# Patient Record
Sex: Female | Born: 1945 | Race: White | Hispanic: No | Marital: Single | Smoking: Never smoker
Health system: Southern US, Community
[De-identification: ages and names within clinical notes are randomized; demographics above are authoritative.]

## PROBLEM LIST (undated history)

## (undated) DIAGNOSIS — F419 Anxiety disorder, unspecified: Secondary | ICD-10-CM

## (undated) DIAGNOSIS — H18523 Epithelial (juvenile) corneal dystrophy, bilateral: Secondary | ICD-10-CM

## (undated) DIAGNOSIS — Z9889 Other specified postprocedural states: Secondary | ICD-10-CM

## (undated) DIAGNOSIS — Z8701 Personal history of pneumonia (recurrent): Secondary | ICD-10-CM

## (undated) DIAGNOSIS — C4491 Basal cell carcinoma of skin, unspecified: Secondary | ICD-10-CM

## (undated) DIAGNOSIS — R7303 Prediabetes: Secondary | ICD-10-CM

## (undated) DIAGNOSIS — C4492 Squamous cell carcinoma of skin, unspecified: Secondary | ICD-10-CM

## (undated) DIAGNOSIS — R112 Nausea with vomiting, unspecified: Secondary | ICD-10-CM

## (undated) DIAGNOSIS — J45909 Unspecified asthma, uncomplicated: Secondary | ICD-10-CM

## (undated) HISTORY — PX: HERNIA REPAIR: SHX51

## (undated) HISTORY — PX: CHOLECYSTECTOMY: SHX55

## (undated) HISTORY — PX: TUBAL LIGATION: SHX77

## (undated) HISTORY — PX: CATARACT EXTRACTION: SUR2

## (undated) HISTORY — PX: TONSILLECTOMY: SUR1361

## (undated) HISTORY — PX: DILATION AND CURETTAGE OF UTERUS: SHX78

---

## 1998-08-24 ENCOUNTER — Ambulatory Visit (HOSPITAL_COMMUNITY): Admission: RE | Admit: 1998-08-24 | Discharge: 1998-08-24 | Payer: Self-pay | Admitting: Psychiatry

## 1998-09-22 ENCOUNTER — Ambulatory Visit (HOSPITAL_COMMUNITY): Admission: RE | Admit: 1998-09-22 | Discharge: 1998-09-22 | Payer: Self-pay | Admitting: Psychiatry

## 1998-10-28 ENCOUNTER — Ambulatory Visit (HOSPITAL_COMMUNITY): Admission: RE | Admit: 1998-10-28 | Discharge: 1998-10-28 | Payer: Self-pay | Admitting: Psychiatry

## 1998-11-30 ENCOUNTER — Ambulatory Visit (HOSPITAL_COMMUNITY): Admission: RE | Admit: 1998-11-30 | Discharge: 1998-11-30 | Payer: Self-pay | Admitting: Psychiatry

## 1998-12-14 ENCOUNTER — Ambulatory Visit (HOSPITAL_COMMUNITY): Admission: RE | Admit: 1998-12-14 | Discharge: 1998-12-14 | Payer: Self-pay | Admitting: Psychiatry

## 1999-01-20 ENCOUNTER — Ambulatory Visit (HOSPITAL_COMMUNITY): Admission: RE | Admit: 1999-01-20 | Discharge: 1999-01-20 | Payer: Self-pay | Admitting: Psychiatry

## 1999-03-02 ENCOUNTER — Ambulatory Visit (HOSPITAL_COMMUNITY): Admission: RE | Admit: 1999-03-02 | Discharge: 1999-03-02 | Payer: Self-pay | Admitting: Psychiatry

## 1999-03-23 ENCOUNTER — Ambulatory Visit (HOSPITAL_COMMUNITY): Admission: RE | Admit: 1999-03-23 | Discharge: 1999-03-23 | Payer: Self-pay | Admitting: Psychiatry

## 1999-04-07 ENCOUNTER — Ambulatory Visit (HOSPITAL_COMMUNITY): Admission: RE | Admit: 1999-04-07 | Discharge: 1999-04-07 | Payer: Self-pay | Admitting: Psychiatry

## 1999-04-21 ENCOUNTER — Ambulatory Visit (HOSPITAL_COMMUNITY): Admission: RE | Admit: 1999-04-21 | Discharge: 1999-04-21 | Payer: Self-pay | Admitting: Psychiatry

## 1999-05-04 ENCOUNTER — Ambulatory Visit (HOSPITAL_COMMUNITY): Admission: RE | Admit: 1999-05-04 | Discharge: 1999-05-04 | Payer: Self-pay | Admitting: Psychiatry

## 1999-05-19 ENCOUNTER — Ambulatory Visit (HOSPITAL_COMMUNITY): Admission: RE | Admit: 1999-05-19 | Discharge: 1999-05-19 | Payer: Self-pay | Admitting: Psychiatry

## 1999-06-06 ENCOUNTER — Ambulatory Visit (HOSPITAL_COMMUNITY): Admission: RE | Admit: 1999-06-06 | Discharge: 1999-06-06 | Payer: Self-pay | Admitting: Psychiatry

## 1999-06-22 ENCOUNTER — Ambulatory Visit (HOSPITAL_COMMUNITY): Admission: RE | Admit: 1999-06-22 | Discharge: 1999-06-22 | Payer: Self-pay | Admitting: Psychiatry

## 1999-07-06 ENCOUNTER — Ambulatory Visit (HOSPITAL_COMMUNITY): Admission: RE | Admit: 1999-07-06 | Discharge: 1999-07-06 | Payer: Self-pay | Admitting: Psychiatry

## 1999-12-06 ENCOUNTER — Other Ambulatory Visit (HOSPITAL_COMMUNITY): Admission: RE | Admit: 1999-12-06 | Discharge: 1999-12-29 | Payer: Self-pay | Admitting: Psychiatry

## 2001-03-13 ENCOUNTER — Encounter: Admission: RE | Admit: 2001-03-13 | Discharge: 2001-03-13 | Payer: Self-pay | Admitting: Psychiatry

## 2001-04-01 ENCOUNTER — Encounter: Admission: RE | Admit: 2001-04-01 | Discharge: 2001-04-01 | Payer: Self-pay | Admitting: Psychiatry

## 2001-06-10 ENCOUNTER — Encounter: Admission: RE | Admit: 2001-06-10 | Discharge: 2001-06-10 | Payer: Self-pay | Admitting: Psychiatry

## 2001-07-10 ENCOUNTER — Encounter: Admission: RE | Admit: 2001-07-10 | Discharge: 2001-07-10 | Payer: Self-pay | Admitting: Psychiatry

## 2001-07-22 ENCOUNTER — Encounter: Admission: RE | Admit: 2001-07-22 | Discharge: 2001-07-22 | Payer: Self-pay | Admitting: Psychiatry

## 2001-07-30 ENCOUNTER — Encounter: Admission: RE | Admit: 2001-07-30 | Discharge: 2001-07-30 | Payer: Self-pay | Admitting: Psychiatry

## 2001-08-02 ENCOUNTER — Encounter: Admission: RE | Admit: 2001-08-02 | Discharge: 2001-08-02 | Payer: Self-pay | Admitting: Psychiatry

## 2001-08-13 ENCOUNTER — Encounter: Admission: RE | Admit: 2001-08-13 | Discharge: 2001-08-13 | Payer: Self-pay | Admitting: Psychiatry

## 2001-08-27 ENCOUNTER — Encounter: Admission: RE | Admit: 2001-08-27 | Discharge: 2001-08-27 | Payer: Self-pay | Admitting: Psychiatry

## 2001-09-10 ENCOUNTER — Encounter: Admission: RE | Admit: 2001-09-10 | Discharge: 2001-09-10 | Payer: Self-pay | Admitting: Psychiatry

## 2001-09-24 ENCOUNTER — Encounter: Admission: RE | Admit: 2001-09-24 | Discharge: 2001-09-24 | Payer: Self-pay | Admitting: Psychiatry

## 2001-10-08 ENCOUNTER — Encounter: Admission: RE | Admit: 2001-10-08 | Discharge: 2001-10-08 | Payer: Self-pay | Admitting: Psychiatry

## 2001-10-16 ENCOUNTER — Emergency Department (HOSPITAL_COMMUNITY): Admission: EM | Admit: 2001-10-16 | Discharge: 2001-10-16 | Payer: Self-pay | Admitting: Emergency Medicine

## 2001-10-22 ENCOUNTER — Encounter: Admission: RE | Admit: 2001-10-22 | Discharge: 2001-10-22 | Payer: Self-pay | Admitting: Psychiatry

## 2001-11-01 ENCOUNTER — Encounter: Admission: RE | Admit: 2001-11-01 | Discharge: 2001-11-01 | Payer: Self-pay | Admitting: Psychiatry

## 2001-11-19 ENCOUNTER — Encounter: Admission: RE | Admit: 2001-11-19 | Discharge: 2001-11-19 | Payer: Self-pay | Admitting: Psychiatry

## 2001-12-04 ENCOUNTER — Encounter: Admission: RE | Admit: 2001-12-04 | Discharge: 2001-12-04 | Payer: Self-pay | Admitting: Psychiatry

## 2001-12-17 ENCOUNTER — Encounter: Admission: RE | Admit: 2001-12-17 | Discharge: 2001-12-17 | Payer: Self-pay | Admitting: Psychiatry

## 2001-12-31 ENCOUNTER — Encounter: Admission: RE | Admit: 2001-12-31 | Discharge: 2001-12-31 | Payer: Self-pay | Admitting: Psychiatry

## 2002-01-14 ENCOUNTER — Encounter: Admission: RE | Admit: 2002-01-14 | Discharge: 2002-01-14 | Payer: Self-pay | Admitting: Psychiatry

## 2002-01-28 ENCOUNTER — Encounter: Admission: RE | Admit: 2002-01-28 | Discharge: 2002-01-28 | Payer: Self-pay | Admitting: Psychiatry

## 2002-02-11 ENCOUNTER — Encounter: Admission: RE | Admit: 2002-02-11 | Discharge: 2002-02-11 | Payer: Self-pay | Admitting: Psychiatry

## 2002-02-25 ENCOUNTER — Encounter: Admission: RE | Admit: 2002-02-25 | Discharge: 2002-03-11 | Payer: Self-pay | Admitting: Psychiatry

## 2002-03-11 ENCOUNTER — Encounter: Admission: RE | Admit: 2002-03-11 | Discharge: 2002-03-11 | Payer: Self-pay | Admitting: Psychiatry

## 2002-03-25 ENCOUNTER — Encounter: Admission: RE | Admit: 2002-03-25 | Discharge: 2002-03-25 | Payer: Self-pay | Admitting: Psychiatry

## 2002-04-08 ENCOUNTER — Encounter: Admission: RE | Admit: 2002-04-08 | Discharge: 2002-04-08 | Payer: Self-pay | Admitting: Psychiatry

## 2002-04-22 ENCOUNTER — Encounter: Admission: RE | Admit: 2002-04-22 | Discharge: 2002-04-22 | Payer: Self-pay | Admitting: Psychiatry

## 2002-05-08 ENCOUNTER — Encounter: Admission: RE | Admit: 2002-05-08 | Discharge: 2002-05-08 | Payer: Self-pay | Admitting: Psychiatry

## 2002-05-27 ENCOUNTER — Encounter: Admission: RE | Admit: 2002-05-27 | Discharge: 2002-05-27 | Payer: Self-pay | Admitting: Psychiatry

## 2002-06-10 ENCOUNTER — Encounter: Admission: RE | Admit: 2002-06-10 | Discharge: 2002-06-10 | Payer: Self-pay | Admitting: Psychiatry

## 2002-06-24 ENCOUNTER — Encounter: Admission: RE | Admit: 2002-06-24 | Discharge: 2002-06-24 | Payer: Self-pay | Admitting: Psychiatry

## 2002-07-23 ENCOUNTER — Encounter: Admission: RE | Admit: 2002-07-23 | Discharge: 2002-07-23 | Payer: Self-pay | Admitting: Psychiatry

## 2002-08-12 ENCOUNTER — Encounter: Admission: RE | Admit: 2002-08-12 | Discharge: 2002-08-12 | Payer: Self-pay | Admitting: Psychiatry

## 2002-08-27 ENCOUNTER — Encounter: Admission: RE | Admit: 2002-08-27 | Discharge: 2002-08-27 | Payer: Self-pay | Admitting: Psychiatry

## 2002-09-09 ENCOUNTER — Encounter: Admission: RE | Admit: 2002-09-09 | Discharge: 2002-09-09 | Payer: Self-pay | Admitting: Psychiatry

## 2002-09-23 ENCOUNTER — Encounter: Admission: RE | Admit: 2002-09-23 | Discharge: 2002-09-23 | Payer: Self-pay | Admitting: Psychiatry

## 2002-10-07 ENCOUNTER — Encounter: Admission: RE | Admit: 2002-10-07 | Discharge: 2002-10-07 | Payer: Self-pay | Admitting: Psychiatry

## 2002-10-21 ENCOUNTER — Encounter: Admission: RE | Admit: 2002-10-21 | Discharge: 2002-10-21 | Payer: Self-pay | Admitting: Psychiatry

## 2002-11-04 ENCOUNTER — Encounter: Admission: RE | Admit: 2002-11-04 | Discharge: 2002-11-04 | Payer: Self-pay | Admitting: Psychiatry

## 2002-11-18 ENCOUNTER — Encounter: Admission: RE | Admit: 2002-11-18 | Discharge: 2002-11-18 | Payer: Self-pay | Admitting: Psychiatry

## 2002-12-09 ENCOUNTER — Encounter: Admission: RE | Admit: 2002-12-09 | Discharge: 2002-12-09 | Payer: Self-pay | Admitting: Psychiatry

## 2002-12-24 ENCOUNTER — Encounter: Admission: RE | Admit: 2002-12-24 | Discharge: 2002-12-24 | Payer: Self-pay | Admitting: Psychiatry

## 2003-01-08 ENCOUNTER — Encounter: Admission: RE | Admit: 2003-01-08 | Discharge: 2003-01-08 | Payer: Self-pay | Admitting: Psychiatry

## 2003-01-22 ENCOUNTER — Encounter: Admission: RE | Admit: 2003-01-22 | Discharge: 2003-01-22 | Payer: Self-pay | Admitting: Psychiatry

## 2004-03-03 ENCOUNTER — Ambulatory Visit (HOSPITAL_COMMUNITY): Payer: Self-pay | Admitting: Psychiatry

## 2004-03-31 ENCOUNTER — Ambulatory Visit (HOSPITAL_COMMUNITY): Payer: Self-pay | Admitting: Psychiatry

## 2005-06-28 ENCOUNTER — Ambulatory Visit: Payer: Self-pay | Admitting: Internal Medicine

## 2005-08-10 ENCOUNTER — Ambulatory Visit: Payer: Self-pay | Admitting: Internal Medicine

## 2005-08-17 ENCOUNTER — Other Ambulatory Visit: Admission: RE | Admit: 2005-08-17 | Discharge: 2005-08-17 | Payer: Self-pay | Admitting: Internal Medicine

## 2005-08-17 ENCOUNTER — Ambulatory Visit: Payer: Self-pay | Admitting: Internal Medicine

## 2005-08-17 ENCOUNTER — Encounter: Payer: Self-pay | Admitting: Internal Medicine

## 2005-09-04 ENCOUNTER — Ambulatory Visit: Payer: Self-pay | Admitting: Internal Medicine

## 2005-09-12 ENCOUNTER — Encounter: Payer: Self-pay | Admitting: Internal Medicine

## 2005-09-14 ENCOUNTER — Encounter: Payer: Self-pay | Admitting: Internal Medicine

## 2005-09-14 ENCOUNTER — Ambulatory Visit: Payer: Self-pay | Admitting: Internal Medicine

## 2005-09-28 ENCOUNTER — Encounter: Admission: RE | Admit: 2005-09-28 | Discharge: 2005-09-28 | Payer: Self-pay | Admitting: Internal Medicine

## 2005-10-04 ENCOUNTER — Ambulatory Visit: Payer: Self-pay | Admitting: Internal Medicine

## 2006-05-22 HISTORY — PX: WRIST SURGERY: SHX841

## 2006-07-27 ENCOUNTER — Emergency Department (HOSPITAL_COMMUNITY): Admission: EM | Admit: 2006-07-27 | Discharge: 2006-07-27 | Payer: Self-pay | Admitting: Emergency Medicine

## 2006-07-31 ENCOUNTER — Ambulatory Visit: Payer: Self-pay | Admitting: Internal Medicine

## 2006-10-04 ENCOUNTER — Encounter: Payer: Self-pay | Admitting: Internal Medicine

## 2006-10-04 ENCOUNTER — Encounter: Admission: RE | Admit: 2006-10-04 | Discharge: 2006-10-04 | Payer: Self-pay | Admitting: Internal Medicine

## 2007-01-02 ENCOUNTER — Ambulatory Visit: Payer: Self-pay | Admitting: Internal Medicine

## 2007-01-02 LAB — CONVERTED CEMR LAB
ALT: 25 units/L (ref 0–35)
AST: 24 units/L (ref 0–37)
Albumin: 3.9 g/dL (ref 3.5–5.2)
Alkaline Phosphatase: 100 units/L (ref 39–117)
BUN: 10 mg/dL (ref 6–23)
Basophils Absolute: 0 10*3/uL (ref 0.0–0.1)
Basophils Relative: 0.3 % (ref 0.0–1.0)
Bilirubin Urine: NEGATIVE
Bilirubin, Direct: 0.1 mg/dL (ref 0.0–0.3)
Blood in Urine, dipstick: NEGATIVE
CO2: 29 meq/L (ref 19–32)
Calcium: 9.5 mg/dL (ref 8.4–10.5)
Chloride: 105 meq/L (ref 96–112)
Cholesterol: 199 mg/dL (ref 0–200)
Creatinine, Ser: 0.7 mg/dL (ref 0.4–1.2)
Eosinophils Absolute: 0.3 10*3/uL (ref 0.0–0.6)
Eosinophils Relative: 4.3 % (ref 0.0–5.0)
GFR calc Af Amer: 109 mL/min
GFR calc non Af Amer: 90 mL/min
Glucose, Bld: 110 mg/dL — ABNORMAL HIGH (ref 70–99)
Glucose, Urine, Semiquant: NEGATIVE
HCT: 38.5 % (ref 36.0–46.0)
HDL: 44.9 mg/dL (ref >39.0)
Hemoglobin: 13.6 g/dL (ref 12.0–15.0)
Ketones, urine, test strip: NEGATIVE
LDL Cholesterol: 120 mg/dL — ABNORMAL HIGH (ref 0–99)
Lymphocytes Relative: 45 % (ref 12.0–46.0)
MCHC: 35.3 g/dL (ref 30.0–36.0)
MCV: 88.7 fL (ref 78.0–100.0)
Monocytes Absolute: 0.5 10*3/uL (ref 0.2–0.7)
Monocytes Relative: 6.9 % (ref 3.0–11.0)
Neutro Abs: 3 10*3/uL (ref 1.4–7.7)
Neutrophils Relative %: 43.5 % (ref 43.0–77.0)
Nitrite: NEGATIVE
Platelets: 237 10*3/uL (ref 150–400)
Potassium: 3.9 meq/L (ref 3.5–5.1)
Protein, U semiquant: NEGATIVE
RBC: 4.34 M/uL (ref 3.87–5.11)
RDW: 11.6 % (ref 11.5–14.6)
Sodium: 140 meq/L (ref 135–145)
Specific Gravity, Urine: 1.015
TSH: 1.84 microintl units/mL (ref 0.35–5.50)
Total Bilirubin: 0.7 mg/dL (ref 0.3–1.2)
Total CHOL/HDL Ratio: 4.4
Total Protein: 7.1 g/dL (ref 6.0–8.3)
Triglycerides: 173 mg/dL — ABNORMAL HIGH (ref 0–149)
Urobilinogen, UA: 0.2
VLDL: 35 mg/dL (ref 0–40)
WBC: 6.9 10*3/uL (ref 4.5–10.5)
pH: 6

## 2007-01-09 ENCOUNTER — Ambulatory Visit: Payer: Self-pay | Admitting: Internal Medicine

## 2007-10-29 ENCOUNTER — Encounter: Admission: RE | Admit: 2007-10-29 | Discharge: 2007-10-29 | Payer: Self-pay | Admitting: Internal Medicine

## 2008-06-23 ENCOUNTER — Ambulatory Visit: Payer: Self-pay | Admitting: Internal Medicine

## 2008-06-23 LAB — CONVERTED CEMR LAB
AST: 24 units/L (ref 0–37)
Albumin: 4 g/dL (ref 3.5–5.2)
Alkaline Phosphatase: 104 units/L (ref 39–117)
Basophils Absolute: 0 10*3/uL (ref 0.0–0.1)
Bilirubin Urine: NEGATIVE
CO2: 29 meq/L (ref 19–32)
Chloride: 106 meq/L (ref 96–112)
Eosinophils Absolute: 0.2 10*3/uL (ref 0.0–0.7)
Glucose, Bld: 127 mg/dL — ABNORMAL HIGH (ref 70–99)
Glucose, Urine, Semiquant: NEGATIVE
Hemoglobin: 13.9 g/dL (ref 12.0–15.0)
Lymphocytes Relative: 40.8 % (ref 12.0–46.0)
MCHC: 34.8 g/dL (ref 30.0–36.0)
MCV: 89.7 fL (ref 78.0–100.0)
Neutro Abs: 3.7 10*3/uL (ref 1.4–7.7)
RBC: 4.45 M/uL (ref 3.87–5.11)
RDW: 11.9 % (ref 11.5–14.6)
Sodium: 140 meq/L (ref 135–145)
TSH: 1.69 microintl units/mL (ref 0.35–5.50)
Total Protein: 7.5 g/dL (ref 6.0–8.3)
Triglycerides: 157 mg/dL — ABNORMAL HIGH (ref 0–149)
Urobilinogen, UA: 0.2
WBC: 7.5 10*3/uL (ref 4.5–10.5)

## 2008-06-30 ENCOUNTER — Ambulatory Visit: Payer: Self-pay | Admitting: Internal Medicine

## 2008-06-30 ENCOUNTER — Other Ambulatory Visit: Admission: RE | Admit: 2008-06-30 | Discharge: 2008-06-30 | Payer: Self-pay | Admitting: Internal Medicine

## 2008-06-30 ENCOUNTER — Encounter: Payer: Self-pay | Admitting: Internal Medicine

## 2008-06-30 DIAGNOSIS — R7309 Other abnormal glucose: Secondary | ICD-10-CM | POA: Insufficient documentation

## 2008-07-03 ENCOUNTER — Telehealth: Payer: Self-pay | Admitting: Internal Medicine

## 2008-07-14 ENCOUNTER — Encounter: Admission: RE | Admit: 2008-07-14 | Discharge: 2008-08-11 | Payer: Self-pay | Admitting: Internal Medicine

## 2008-07-30 ENCOUNTER — Telehealth: Payer: Self-pay | Admitting: Internal Medicine

## 2008-08-11 ENCOUNTER — Encounter: Payer: Self-pay | Admitting: Internal Medicine

## 2008-10-21 ENCOUNTER — Ambulatory Visit: Payer: Self-pay | Admitting: Internal Medicine

## 2008-10-21 DIAGNOSIS — E785 Hyperlipidemia, unspecified: Secondary | ICD-10-CM | POA: Insufficient documentation

## 2008-10-21 LAB — CONVERTED CEMR LAB
ALT: 24 units/L (ref 0–35)
Albumin: 4.1 g/dL (ref 3.5–5.2)
Alkaline Phosphatase: 94 units/L (ref 39–117)
BUN: 10 mg/dL (ref 6–23)
Bilirubin, Direct: 0 mg/dL (ref 0.0–0.3)
Chloride: 111 meq/L (ref 96–112)
Cholesterol: 186 mg/dL (ref 0–200)
Creatinine, Ser: 0.7 mg/dL (ref 0.4–1.2)
Glucose, Bld: 112 mg/dL — ABNORMAL HIGH (ref 70–99)
Potassium: 4.3 meq/L (ref 3.5–5.1)
Sodium: 144 meq/L (ref 135–145)

## 2008-10-28 ENCOUNTER — Ambulatory Visit: Payer: Self-pay | Admitting: Internal Medicine

## 2008-10-29 ENCOUNTER — Encounter: Admission: RE | Admit: 2008-10-29 | Discharge: 2008-10-29 | Payer: Self-pay | Admitting: Internal Medicine

## 2008-11-05 ENCOUNTER — Encounter: Admission: RE | Admit: 2008-11-05 | Discharge: 2008-11-05 | Payer: Self-pay | Admitting: Internal Medicine

## 2009-02-19 ENCOUNTER — Ambulatory Visit: Payer: Self-pay | Admitting: Internal Medicine

## 2009-02-19 DIAGNOSIS — M549 Dorsalgia, unspecified: Secondary | ICD-10-CM | POA: Insufficient documentation

## 2009-06-16 ENCOUNTER — Ambulatory Visit: Payer: Self-pay | Admitting: Internal Medicine

## 2009-06-25 ENCOUNTER — Encounter: Admission: RE | Admit: 2009-06-25 | Discharge: 2009-06-25 | Payer: Self-pay | Admitting: Internal Medicine

## 2009-07-02 ENCOUNTER — Encounter: Admission: RE | Admit: 2009-07-02 | Discharge: 2009-08-12 | Payer: Self-pay | Admitting: Internal Medicine

## 2009-07-05 ENCOUNTER — Encounter: Payer: Self-pay | Admitting: Internal Medicine

## 2009-08-16 ENCOUNTER — Encounter: Payer: Self-pay | Admitting: Internal Medicine

## 2009-09-08 ENCOUNTER — Ambulatory Visit: Payer: Self-pay | Admitting: Ophthalmology

## 2009-09-28 ENCOUNTER — Ambulatory Visit: Payer: Self-pay | Admitting: Ophthalmology

## 2009-11-18 ENCOUNTER — Encounter: Admission: RE | Admit: 2009-11-18 | Discharge: 2009-11-18 | Payer: Self-pay | Admitting: Internal Medicine

## 2010-05-29 IMAGING — MG MM DIAGNOSTIC LTD RIGHT
1 series · 1 of 1 positions shown · non-contrast
Comparison: [DATE] [DATE], [DATE], [DATE] [DATE], [DATE], [DATE] [DATE], [DATE]

CLINICAL DATA: Called back from screening mammogram for possible
mass right breast

DIGITAL DIAGNOSTIC RIGHT MAMMOGRAM November 05, 2008

[R MLO]
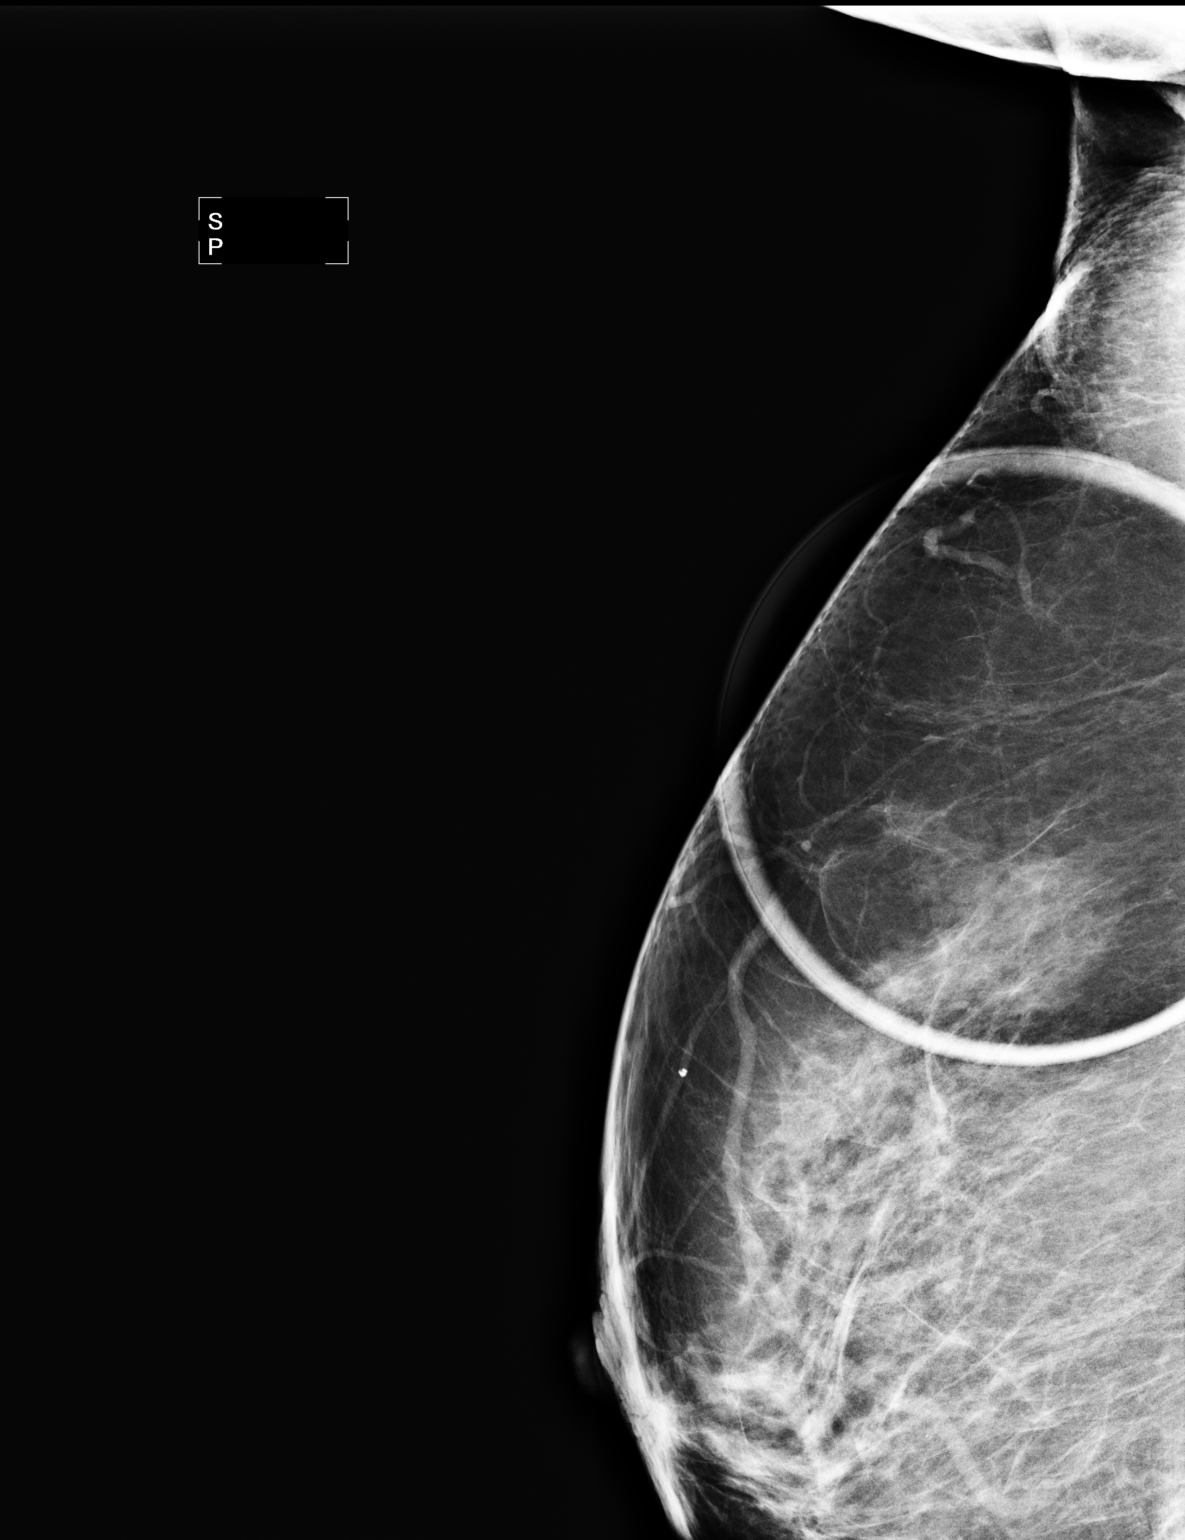

[1 of 1 positions shown; findings below may reference images not displayed]

FINDINGS: Spot compression CC and MLO views of the right breast
are submitted.  Previously questioned asymmetry does not persist.
No suspicious abnormality is identified.
IMPRESSION: Negative, recommend routine screening mammogram back on schedule

BI-RADS CATEGORY 1:  Negative.

## 2010-06-12 ENCOUNTER — Encounter: Payer: Self-pay | Admitting: Internal Medicine

## 2010-06-13 ENCOUNTER — Encounter: Payer: Self-pay | Admitting: Internal Medicine

## 2010-06-21 NOTE — Assessment & Plan Note (Signed)
Summary: LOWER BACK AND HIP PAIN/CJR   Vital Signs:  Patient profile:   65 year old female Weight:      182 pounds Temp:     98.4 degrees F Pulse rate:   84 / minute Resp:     12 per minute BP sitting:   156 / 90  (left arm)  Vitals Entered By: Gladis Riffle, RN (June 16, 2009 9:55 AM)   History of Present Illness: back pain---started several hours after helping someone tip a table. pain is mid low back and radiates to right leg to mid calf. no weakness. no fever or chills.  sxs now for 3 weeks sxs interfere with sleep sxs come and go, worse with standing , walking and lying  All other systems reviewed and were negative   Preventive Screening-Counseling & Management  Alcohol-Tobacco     Smoking Status: never  Current Problems (verified): 1)  Back Pain  (ICD-724.5) 2)  Other and Unspecified Hyperlipidemia  (ICD-272.4) 3)  Hyperglycemia  (ICD-790.29) 4)  Examination, Routine Medical  (ICD-V70.0) 5)  Family History Diabetes 1st Degree Relative  (ICD-V18.0)  Current Medications (verified): 1)  Meloxicam 15 Mg Tabs (Meloxicam) .... One By Mouth Daily With Food  Allergies: 1)  ! Dextran 500000 (Dextran) 2)  ! Gabitril (Tiagabine Hcl) 3)  ! Penicillin V Potassium (Penicillin V Potassium)  Comments:  Nurse/Medical Assistant: c/o lower back and right hip pain x 3 weeks; thinks is getting worse--also c/o growth on labia x 4 weeks  The patient's medications and allergies were reviewed with the patient and were updated in the Medication and Allergy Lists. Gladis Riffle, RN (June 16, 2009 9:56 AM)  Flu Vaccine Consent Questions     Do you have a history of severe allergic reactions to this vaccine? no    Any prior history of allergic reactions to egg and/or gelatin? no    Do you have a sensitivity to the preservative Thimersol? no    Do you have a past history of Guillan-Barre Syndrome? no    Do you currently have an acute febrile illness? no    Have you ever had a severe  reaction to latex? no    Vaccine information given and explained to patient? yes    Are you currently pregnant? no    Lot Number:[id number]   Exp Date:11/18/2009   Site Given  Right Deltoid IM   Past History:  Past Medical History: Last updated: 01/03/2007 Allergic rhinitis  Past Surgical History: Last updated: 06/30/2008 Cholecystectomy T&A as a child D&C hand surgery 2008--"cartilage repair"  Family History: Last updated: 06/30/2008 father-83 prostate cancer Family History Diabetes 1st degree relative-father Mother breast CA (other maternal relatives)--deceased 66 yo   Social History: Last updated: 10/28/2008 Divorced Never Smoked Alcohol use-no Drug use-no Regular exercise-no Occupation:--managing book store  Risk Factors: Exercise: no (02/19/2009)  Risk Factors: Smoking Status: never (06/16/2009)  Review of Systems       All other systems reviewed and were negative   Physical Exam  General:  Well-developed,well-nourished,in no acute distress; alert,appropriate and cooperative throughout examination Head:  normocephalic and atraumatic.   Eyes:  pupils equal and pupils round.   Abdomen:  soft and non-tender.   overweight Msk:  FROM both hips SLR negativ Neurologic:  absent right AJ KJ reflexes normal strength in lower extremities---normal   Impression & Recommendations:  Problem # 1:  BACK PAIN (ICD-724.5)  now ongoing for 1 month without relief she has radicular sxs and therefore likely has  a herniated disc.   Her updated medication list for this problem includes:    Meloxicam 15 Mg Tabs (Meloxicam) ..... One by mouth daily with food    Cyclobenzaprine Hcl 10 Mg Tabs (Cyclobenzaprine hcl) .Marland Kitchen... 1 by mouth 2 times daily as needed for back pain    Hydrocodone-acetaminophen 5-325 Mg Tabs (Hydrocodone-acetaminophen) .Marland Kitchen... 1 by mouth up to 4 times per day as needed for pain  Orders: Radiology Referral (Radiology) boil--groin---appears to be  resolving  Complete Medication List: 1)  Meloxicam 15 Mg Tabs (Meloxicam) .... One by mouth daily with food 2)  Cyclobenzaprine Hcl 10 Mg Tabs (Cyclobenzaprine hcl) .Marland Kitchen.. 1 by mouth 2 times daily as needed for back pain 3)  Hydrocodone-acetaminophen 5-325 Mg Tabs (Hydrocodone-acetaminophen) .Marland Kitchen.. 1 by mouth up to 4 times per day as needed for pain  Other Orders: Admin 1st Vaccine ([address]) Flu Vaccine 25yrs + ([address])  Patient Instructions: 1)  you will get call for MRI schedule Prescriptions: HYDROCODONE-ACETAMINOPHEN 5-325 MG TABS (HYDROCODONE-ACETAMINOPHEN) 1 by mouth up to 4 times per day as needed for pain  #20 x 1   Entered and Authorized by:   Birdie Sons MD   Signed by:   Birdie Sons MD on 06/16/2009   Method used:   Print then Give to Patient   RxID:   [id number] CYCLOBENZAPRINE HCL 10 MG  TABS (CYCLOBENZAPRINE HCL) 1 by mouth 2 times daily as needed for back pain  #30 x 0   Entered and Authorized by:   Birdie Sons MD   Signed by:   Birdie Sons MD on 06/16/2009   Method used:   Electronically to        [id number] (retail)       [address]       [place], [place]  [address]       Ph: [phone]       Fax: [phone]   RxID:   [id number]

## 2010-06-21 NOTE — Miscellaneous (Signed)
Summary: Initial Summary for PT Services/[facility]  Initial Summary for PT Services/[facility]   Imported By: Maryln Gottron 07/12/2009 13:44:53  _____________________________________________________________________  External Attachment:    Type:   Image     Comment:   External Document

## 2010-06-21 NOTE — Miscellaneous (Signed)
Summary: Discharge Summary for PT [facility]  Discharge Summary for PT Services/[facility]   Imported By: Maryln Gottron 08/23/2009 15:18:57  _____________________________________________________________________  External Attachment:    Type:   Image     Comment:   External Document

## 2010-10-07 NOTE — Letter (Signed)
February 12, 2006     Darrold Span  [address]  [place], [place] [address]   MRN:  [id number]  /  DOB:  [birth year]   Dear Ms. Littrell:   I am writing to follow up with you regarding your previous incomplete  colonoscopy.  I had recommended that we attempt a repeat colonoscopy to  repeat your screening.   It seems that you have decided not to do so, I think you did have some  questions about payment issues for this.  I would be happy to meet with you  regarding this or talk to you on the phone, as I think it is important to  complete your colonoscopy screening process.   Repeating a colonoscopy, CT colonoscopy, Hemoccult, and barium enema are all  options that I could discuss.  You could also ask Dr. Cato Mulligan about this as  well.   I want you to understand that some of your colon was not seen, so colon  polyps or colon cancer could be there and lead to significant health  problems, and even death in the future.   Again, I am willing to discuss your concerns and to try to sort this out for  you.   Sincerely,     Iva Boop, MD,FACG   CEG/MedQ  DD:  02/12/2006  DT:  02/13/2006 Job #:  [id number]  CC:    Valetta Mole. Swords, MD

## 2011-07-03 ENCOUNTER — Other Ambulatory Visit: Payer: Self-pay | Admitting: Internal Medicine

## 2011-07-03 DIAGNOSIS — Z1231 Encounter for screening mammogram for malignant neoplasm of breast: Secondary | ICD-10-CM

## 2011-07-17 ENCOUNTER — Ambulatory Visit
Admission: RE | Admit: 2011-07-17 | Discharge: 2011-07-17 | Disposition: A | Discharge status: Home / Self Care | Payer: BC Managed Care – PPO | Source: Ambulatory Visit | Attending: Internal Medicine | Admitting: Internal Medicine

## 2011-07-17 DIAGNOSIS — Z1231 Encounter for screening mammogram for malignant neoplasm of breast: Secondary | ICD-10-CM

## 2012-03-25 ENCOUNTER — Other Ambulatory Visit: Payer: Self-pay | Admitting: Obstetrics and Gynecology

## 2012-03-25 ENCOUNTER — Other Ambulatory Visit (HOSPITAL_COMMUNITY)
Admission: RE | Admit: 2012-03-25 | Discharge: 2012-03-25 | Disposition: A | Discharge status: Home / Self Care | Payer: BC Managed Care – PPO | Source: Ambulatory Visit | Attending: Obstetrics and Gynecology | Admitting: Obstetrics and Gynecology

## 2012-03-25 DIAGNOSIS — Z01419 Encounter for gynecological examination (general) (routine) without abnormal findings: Secondary | ICD-10-CM | POA: Insufficient documentation

## 2012-04-12 ENCOUNTER — Encounter (HOSPITAL_COMMUNITY): Payer: Self-pay | Admitting: Pharmacist

## 2012-04-16 ENCOUNTER — Other Ambulatory Visit: Payer: Self-pay | Admitting: Obstetrics and Gynecology

## 2012-04-16 MED ORDER — CIPROFLOXACIN IN D5W 400 MG/200ML IV SOLN: 400.0000 mg | Freq: Once | INTRAVENOUS | Status: DC

## 2012-04-16 MED ORDER — CLINDAMYCIN PHOSPHATE 600 MG/4ML IJ SOLN: 900.0000 mg | Freq: Once | INTRAMUSCULAR | Status: DC

## 2012-04-22 ENCOUNTER — Encounter (HOSPITAL_COMMUNITY): Payer: Self-pay

## 2012-04-22 ENCOUNTER — Encounter (HOSPITAL_COMMUNITY)
Admission: RE | Admit: 2012-04-22 | Discharge: 2012-04-22 | Disposition: A | Discharge status: Home / Self Care | Payer: BC Managed Care – PPO | Source: Ambulatory Visit | Attending: Obstetrics and Gynecology | Admitting: Obstetrics and Gynecology

## 2012-04-22 HISTORY — DX: Other specified postprocedural states: Z98.890

## 2012-04-22 HISTORY — DX: Unspecified asthma, uncomplicated: J45.909

## 2012-04-22 HISTORY — DX: Other specified postprocedural states: R11.2

## 2012-04-22 LAB — CBC
HCT: 38.3 % (ref 36.0–46.0)
Hemoglobin: 13.3 g/dL (ref 12.0–15.0)
MCH: 30.6 pg (ref 26.0–34.0)
MCHC: 34.7 g/dL (ref 30.0–36.0)
MCV: 88 fL (ref 78.0–100.0)
RBC: 4.35 MIL/uL (ref 3.87–5.11)

## 2012-04-22 LAB — SURGICAL PCR SCREEN: Staphylococcus aureus: NEGATIVE

## 2012-04-22 LAB — COMPREHENSIVE METABOLIC PANEL
BUN: 10 mg/dL (ref 6–23)
CO2: 28 mEq/L (ref 19–32)
Calcium: 9.7 mg/dL (ref 8.4–10.5)
Creatinine, Ser: 0.64 mg/dL (ref 0.50–1.10)
GFR calc Af Amer: >90 mL/min (ref >90)
GFR calc non Af Amer: >90 mL/min (ref >90)
Glucose, Bld: 86 mg/dL (ref 70–99)
Sodium: 137 mEq/L (ref 135–145)
Total Protein: 7.7 g/dL (ref 6.0–8.3)

## 2012-04-22 NOTE — Patient Instructions (Addendum)
20 CHARISMA CHARLOT  04/22/2012   Your procedure is scheduled on:  04/25/12  Enter through the Main Entrance of [facility] at 6 AM.  Pick up the phone at the desk and dial [phone].   Call this number if you have problems the morning of surgery: [phone]   Remember:   Do not eat food:After Midnight.  Do not drink clear liquids: After Midnight.  Take these medicines the morning of surgery with A SIP OF WATER: NA   Do not wear jewelry, make-up or nail polish.  Do not wear lotions, powders, or perfumes. You may wear deodorant.  Do not shave 48 hours prior to surgery.  Do not bring valuables to the hospital.  Contacts, dentures or bridgework may not be worn into surgery.  Leave suitcase in the car. After surgery it may be brought to your room.  For patients admitted to the hospital, checkout time is 11:00 AM the day of discharge.   Patients discharged the day of surgery will not be allowed to drive home.  Name and phone number of your driver: NA  Special Instructions: Shower using CHG 2 nights before surgery and the night before surgery.  If you shower the day of surgery use CHG.  Use special wash - you have one bottle of CHG for all showers.  You should use approximately 1/3 of the bottle for each shower.   Please read over the following fact sheets that you were given: MRSA Information

## 2012-04-25 ENCOUNTER — Ambulatory Visit (HOSPITAL_COMMUNITY)
Admission: RE | Admit: 2012-04-25 | Discharge: 2012-04-26 | Disposition: A | Discharge status: Home / Self Care | Payer: BC Managed Care – PPO | Source: Ambulatory Visit | Attending: Obstetrics and Gynecology | Admitting: Obstetrics and Gynecology

## 2012-04-25 ENCOUNTER — Encounter (HOSPITAL_COMMUNITY): Payer: Self-pay | Admitting: Anesthesiology

## 2012-04-25 ENCOUNTER — Inpatient Hospital Stay (HOSPITAL_COMMUNITY): Payer: BC Managed Care – PPO | Admitting: Anesthesiology

## 2012-04-25 ENCOUNTER — Encounter (HOSPITAL_COMMUNITY)
Admission: RE | Disposition: A | Discharge status: Home / Self Care | Payer: Self-pay | Source: Ambulatory Visit | Attending: Obstetrics and Gynecology

## 2012-04-25 ENCOUNTER — Encounter (HOSPITAL_COMMUNITY): Payer: Self-pay | Admitting: *Deleted

## 2012-04-25 DIAGNOSIS — D250 Submucous leiomyoma of uterus: Secondary | ICD-10-CM | POA: Insufficient documentation

## 2012-04-25 DIAGNOSIS — Z01812 Encounter for preprocedural laboratory examination: Secondary | ICD-10-CM | POA: Insufficient documentation

## 2012-04-25 DIAGNOSIS — D251 Intramural leiomyoma of uterus: Secondary | ICD-10-CM | POA: Insufficient documentation

## 2012-04-25 DIAGNOSIS — N9489 Other specified conditions associated with female genital organs and menstrual cycle: Secondary | ICD-10-CM | POA: Insufficient documentation

## 2012-04-25 DIAGNOSIS — N838 Other noninflammatory disorders of ovary, fallopian tube and broad ligament: Secondary | ICD-10-CM | POA: Insufficient documentation

## 2012-04-25 DIAGNOSIS — R7309 Other abnormal glucose: Secondary | ICD-10-CM

## 2012-04-25 DIAGNOSIS — N812 Incomplete uterovaginal prolapse: Principal | ICD-10-CM | POA: Insufficient documentation

## 2012-04-25 DIAGNOSIS — N8000 Endometriosis of the uterus, unspecified: Secondary | ICD-10-CM | POA: Insufficient documentation

## 2012-04-25 DIAGNOSIS — E785 Hyperlipidemia, unspecified: Secondary | ICD-10-CM

## 2012-04-25 DIAGNOSIS — Z90710 Acquired absence of both cervix and uterus: Secondary | ICD-10-CM

## 2012-04-25 DIAGNOSIS — Z01818 Encounter for other preprocedural examination: Secondary | ICD-10-CM | POA: Insufficient documentation

## 2012-04-25 DIAGNOSIS — M549 Dorsalgia, unspecified: Secondary | ICD-10-CM

## 2012-04-25 HISTORY — PX: CYSTOCELE REPAIR: SHX163

## 2012-04-25 HISTORY — PX: ROBOTIC ASSISTED TOTAL HYSTERECTOMY: SHX6085

## 2012-04-25 HISTORY — PX: SALPINGOOPHORECTOMY: SHX82

## 2012-04-25 LAB — TYPE AND SCREEN: ABO/RH(D): B POS

## 2012-04-25 SURGERY — ROBOTIC ASSISTED TOTAL HYSTERECTOMY
Anesthesia: General | Site: Vagina | Wound class: Clean Contaminated

## 2012-04-25 MED ORDER — GLYCOPYRROLATE 0.2 MG/ML IJ SOLN
INTRAMUSCULAR | Status: AC
  Filled 2012-04-25: qty 1

## 2012-04-25 MED ORDER — PNEUMOCOCCAL VAC POLYVALENT 25 MCG/0.5ML IJ INJ
0.5000 mL | INJECTION | INTRAMUSCULAR | Status: DC
  Filled 2012-04-25: qty 0.5

## 2012-04-25 MED ORDER — PROPOFOL 10 MG/ML IV EMUL
INTRAVENOUS | Status: DC | PRN
  Administered 2012-04-25: 150 mg via INTRAVENOUS

## 2012-04-25 MED ORDER — DEXAMETHASONE SODIUM PHOSPHATE 10 MG/ML IJ SOLN
INTRAMUSCULAR | Status: AC
  Filled 2012-04-25: qty 1

## 2012-04-25 MED ORDER — HYDROMORPHONE HCL PF 1 MG/ML IJ SOLN
0.2000 mg | INTRAMUSCULAR | Status: DC | PRN
  Administered 2012-04-25: 0.6 mg via INTRAVENOUS
  Filled 2012-04-25: qty 1

## 2012-04-25 MED ORDER — MIDAZOLAM HCL 5 MG/5ML IJ SOLN
INTRAMUSCULAR | Status: DC | PRN
  Administered 2012-04-25: 1 mg via INTRAVENOUS

## 2012-04-25 MED ORDER — DEXAMETHASONE SODIUM PHOSPHATE 4 MG/ML IJ SOLN
INTRAMUSCULAR | Status: DC | PRN
  Administered 2012-04-25: 5 mg via INTRAVENOUS

## 2012-04-25 MED ORDER — LIDOCAINE-EPINEPHRINE 1 %-1:100000 IJ SOLN
INTRAMUSCULAR | Status: DC | PRN
  Administered 2012-04-25: 20 mL

## 2012-04-25 MED ORDER — ACETAMINOPHEN 10 MG/ML IV SOLN: 1000.0000 mg | Freq: Once | INTRAVENOUS | Status: DC

## 2012-04-25 MED ORDER — LACTATED RINGERS IV SOLN
INTRAVENOUS | Status: DC
  Administered 2012-04-25 (×3): via INTRAVENOUS

## 2012-04-25 MED ORDER — MEPERIDINE HCL 25 MG/ML IJ SOLN: 6.2500 mg | INTRAMUSCULAR | Status: DC | PRN

## 2012-04-25 MED ORDER — ONDANSETRON HCL 4 MG/2ML IJ SOLN
INTRAMUSCULAR | Status: AC
  Filled 2012-04-25: qty 2

## 2012-04-25 MED ORDER — CLINDAMYCIN PHOSPHATE 900 MG/50ML IV SOLN
900.0000 mg | Freq: Once | INTRAVENOUS | Status: AC
  Administered 2012-04-25: 900 mg via INTRAVENOUS
  Filled 2012-04-25: qty 50

## 2012-04-25 MED ORDER — HYDROMORPHONE HCL PF 1 MG/ML IJ SOLN
0.2500 mg | INTRAMUSCULAR | Status: DC | PRN
  Administered 2012-04-25 (×4): 0.5 mg via INTRAVENOUS

## 2012-04-25 MED ORDER — NEOSTIGMINE METHYLSULFATE 1 MG/ML IJ SOLN
INTRAMUSCULAR | Status: AC
  Filled 2012-04-25: qty 10

## 2012-04-25 MED ORDER — ROPIVACAINE HCL 5 MG/ML IJ SOLN
INTRAMUSCULAR | Status: AC
  Filled 2012-04-25: qty 60

## 2012-04-25 MED ORDER — PHENYLEPHRINE HCL 10 MG/ML IJ SOLN
INTRAMUSCULAR | Status: DC | PRN
  Administered 2012-04-25: 40 ug via INTRAVENOUS

## 2012-04-25 MED ORDER — IBUPROFEN 600 MG PO TABS
600.0000 mg | ORAL_TABLET | Freq: Four times a day (QID) | ORAL | Status: DC | PRN
  Administered 2012-04-25 – 2012-04-26 (×2): 600 mg via ORAL
  Filled 2012-04-25 (×2): qty 1

## 2012-04-25 MED ORDER — HYDROMORPHONE HCL PF 1 MG/ML IJ SOLN
INTRAMUSCULAR | Status: AC
  Administered 2012-04-25: 0.5 mg via INTRAVENOUS
  Filled 2012-04-25: qty 1

## 2012-04-25 MED ORDER — LACTATED RINGERS IR SOLN
Status: DC | PRN
  Administered 2012-04-25: 3000 mL

## 2012-04-25 MED ORDER — CEFAZOLIN SODIUM-DEXTROSE 2-3 GM-% IV SOLR
INTRAVENOUS | Status: AC
  Filled 2012-04-25: qty 50

## 2012-04-25 MED ORDER — NEOSTIGMINE METHYLSULFATE 1 MG/ML IJ SOLN
INTRAMUSCULAR | Status: DC | PRN
  Administered 2012-04-25: 3 mg via INTRAVENOUS

## 2012-04-25 MED ORDER — OXYCODONE-ACETAMINOPHEN 5-325 MG PO TABS
1.0000 | ORAL_TABLET | ORAL | Status: DC | PRN
  Administered 2012-04-25 – 2012-04-26 (×2): 2 via ORAL
  Filled 2012-04-25 (×2): qty 2

## 2012-04-25 MED ORDER — PROMETHAZINE HCL 25 MG/ML IJ SOLN: 6.2500 mg | INTRAMUSCULAR | Status: DC | PRN

## 2012-04-25 MED ORDER — METHYLENE BLUE 1 % INJ SOLN
INTRAMUSCULAR | Status: AC
  Filled 2012-04-25: qty 1

## 2012-04-25 MED ORDER — LIDOCAINE HCL (CARDIAC) 20 MG/ML IV SOLN
INTRAVENOUS | Status: DC | PRN
  Administered 2012-04-25: 70 mg via INTRAVENOUS

## 2012-04-25 MED ORDER — STERILE WATER FOR IRRIGATION IR SOLN
Status: DC | PRN
  Administered 2012-04-25: 200 mL via INTRAVESICAL

## 2012-04-25 MED ORDER — ACETAMINOPHEN 10 MG/ML IV SOLN
INTRAVENOUS | Status: AC
  Administered 2012-04-25: 1000 mg via INTRAVENOUS
  Filled 2012-04-25: qty 100

## 2012-04-25 MED ORDER — ROCURONIUM BROMIDE 50 MG/5ML IV SOLN
INTRAVENOUS | Status: AC
  Filled 2012-04-25: qty 1

## 2012-04-25 MED ORDER — EPHEDRINE 5 MG/ML INJ
INTRAVENOUS | Status: DC | PRN
  Administered 2012-04-25: 10 mg via INTRAVENOUS
  Administered 2012-04-25: 15 mg via INTRAVENOUS

## 2012-04-25 MED ORDER — ROCURONIUM BROMIDE 100 MG/10ML IV SOLN
INTRAVENOUS | Status: DC | PRN
  Administered 2012-04-25 (×3): 10 mg via INTRAVENOUS
  Administered 2012-04-25: 40 mg via INTRAVENOUS

## 2012-04-25 MED ORDER — PHENYLEPHRINE 40 MCG/ML (10ML) SYRINGE FOR IV PUSH (FOR BLOOD PRESSURE SUPPORT)
PREFILLED_SYRINGE | INTRAVENOUS | Status: AC
  Filled 2012-04-25: qty 5

## 2012-04-25 MED ORDER — ONDANSETRON HCL 4 MG/2ML IJ SOLN: 4.0000 mg | Freq: Once | INTRAMUSCULAR | Status: DC | PRN

## 2012-04-25 MED ORDER — FENTANYL CITRATE 0.05 MG/ML IJ SOLN
INTRAMUSCULAR | Status: DC | PRN
  Administered 2012-04-25: 100 ug via INTRAVENOUS
  Administered 2012-04-25 (×3): 50 ug via INTRAVENOUS

## 2012-04-25 MED ORDER — ROPIVACAINE HCL 5 MG/ML IJ SOLN
INTRAMUSCULAR | Status: DC | PRN
  Administered 2012-04-25: 60 mL

## 2012-04-25 MED ORDER — KETOROLAC TROMETHAMINE 30 MG/ML IJ SOLN: 15.0000 mg | Freq: Once | INTRAMUSCULAR | Status: DC | PRN

## 2012-04-25 MED ORDER — CEFAZOLIN SODIUM-DEXTROSE 2-3 GM-% IV SOLR: 2.0000 g | INTRAVENOUS | Status: DC

## 2012-04-25 MED ORDER — LACTATED RINGERS IV SOLN
INTRAVENOUS | Status: DC
  Administered 2012-04-25 – 2012-04-26 (×3): via INTRAVENOUS

## 2012-04-25 MED ORDER — FENTANYL CITRATE 0.05 MG/ML IJ SOLN
INTRAMUSCULAR | Status: AC
  Filled 2012-04-25: qty 5

## 2012-04-25 MED ORDER — ESTRADIOL 0.1 MG/GM VA CREA
TOPICAL_CREAM | VAGINAL | Status: AC
  Filled 2012-04-25: qty 42.5

## 2012-04-25 MED ORDER — ONDANSETRON HCL 4 MG/2ML IJ SOLN
4.0000 mg | Freq: Four times a day (QID) | INTRAMUSCULAR | Status: DC | PRN
  Administered 2012-04-25: 4 mg via INTRAVENOUS
  Filled 2012-04-25: qty 2

## 2012-04-25 MED ORDER — ONDANSETRON HCL 4 MG/2ML IJ SOLN
INTRAMUSCULAR | Status: DC | PRN
  Administered 2012-04-25: 4 mg via INTRAVENOUS

## 2012-04-25 MED ORDER — ESTRADIOL 0.1 MG/GM VA CREA
TOPICAL_CREAM | VAGINAL | Status: DC | PRN
  Administered 2012-04-25: 1 via VAGINAL

## 2012-04-25 MED ORDER — CIPROFLOXACIN IN D5W 400 MG/200ML IV SOLN
400.0000 mg | Freq: Once | INTRAVENOUS | Status: AC
  Administered 2012-04-25: 400 mg via INTRAVENOUS
  Filled 2012-04-25: qty 200

## 2012-04-25 MED ORDER — LIDOCAINE HCL (CARDIAC) 20 MG/ML IV SOLN
INTRAVENOUS | Status: AC
  Filled 2012-04-25: qty 5

## 2012-04-25 MED ORDER — MIDAZOLAM HCL 2 MG/2ML IJ SOLN
INTRAMUSCULAR | Status: AC
  Filled 2012-04-25: qty 2

## 2012-04-25 MED ORDER — ONDANSETRON HCL 4 MG PO TABS: 4.0000 mg | ORAL_TABLET | Freq: Four times a day (QID) | ORAL | Status: DC | PRN

## 2012-04-25 MED ORDER — PROPOFOL 10 MG/ML IV EMUL
INTRAVENOUS | Status: AC
  Filled 2012-04-25: qty 20

## 2012-04-25 MED ORDER — ALUM & MAG HYDROXIDE-SIMETH 200-200-20 MG/5ML PO SUSP: 30.0000 mL | ORAL | Status: DC | PRN

## 2012-04-25 MED ORDER — EPHEDRINE 5 MG/ML INJ
INTRAVENOUS | Status: AC
  Filled 2012-04-25: qty 10

## 2012-04-25 MED ORDER — GLYCOPYRROLATE 0.2 MG/ML IJ SOLN
INTRAMUSCULAR | Status: DC | PRN
  Administered 2012-04-25: 0.4 mg via INTRAVENOUS

## 2012-04-25 SURGICAL SUPPLY — 66 items
ADH SKN CLS APL DERMABOND .7 (GAUZE/BANDAGES/DRESSINGS) ×4
BAG URINE DRAINAGE (UROLOGICAL SUPPLIES) ×5 IMPLANT
BARRIER ADHS 3X4 INTERCEED (GAUZE/BANDAGES/DRESSINGS) ×3 IMPLANT
BRR ADH 4X3 ABS CNTRL BYND (GAUZE/BANDAGES/DRESSINGS)
CABLE HIGH FREQUENCY MONO STRZ (ELECTRODE) ×5 IMPLANT
CANISTER SUCTION 2500CC (MISCELLANEOUS) ×5 IMPLANT
CATH FOLEY 3WAY  5CC 16FR (CATHETERS) ×1
CATH FOLEY 3WAY 5CC 16FR (CATHETERS) ×4 IMPLANT
CLOTH BEACON ORANGE TIMEOUT ST (SAFETY) ×5 IMPLANT
CONT PATH 16OZ SNAP LID 3702 (MISCELLANEOUS) ×5 IMPLANT
CORDS BIPOLAR (ELECTRODE) ×2 IMPLANT
COVER MAYO STAND STRL (DRAPES) ×5 IMPLANT
COVER TABLE BACK 60X90 (DRAPES) ×10 IMPLANT
COVER TIP SHEARS 8 DVNC (MISCELLANEOUS) ×4 IMPLANT
COVER TIP SHEARS 8MM DA VINCI (MISCELLANEOUS) ×1
DECANTER SPIKE VIAL GLASS SM (MISCELLANEOUS) ×7 IMPLANT
DERMABOND ADVANCED (GAUZE/BANDAGES/DRESSINGS) ×1
DERMABOND ADVANCED .7 DNX12 (GAUZE/BANDAGES/DRESSINGS) ×4 IMPLANT
DILATOR CANAL MILEX (MISCELLANEOUS) ×5 IMPLANT
DRAPE HUG U DISPOSABLE (DRAPE) ×5 IMPLANT
DRAPE LG THREE QUARTER DISP (DRAPES) ×10 IMPLANT
DRAPE WARM FLUID 44X44 (DRAPE) ×5 IMPLANT
ELECT REM PT RETURN 9FT ADLT (ELECTROSURGICAL) ×5
ELECTRODE REM PT RTRN 9FT ADLT (ELECTROSURGICAL) ×4 IMPLANT
EVACUATOR SMOKE 8.L (FILTER) ×5 IMPLANT
GAUZE PACKING 2X5 YD STERILE (GAUZE/BANDAGES/DRESSINGS) ×2 IMPLANT
GLOVE BIO SURGEON STRL SZ7 (GLOVE) ×4 IMPLANT
GLOVE BIOGEL M 6.5 STRL (GLOVE) ×15 IMPLANT
GLOVE BIOGEL PI IND STRL 6.5 (GLOVE) ×8 IMPLANT
GLOVE BIOGEL PI IND STRL 7.0 (GLOVE) ×20 IMPLANT
GLOVE BIOGEL PI INDICATOR 6.5 (GLOVE) ×2
GLOVE BIOGEL PI INDICATOR 7.0 (GLOVE) ×5
GOWN STRL REIN XL XLG (GOWN DISPOSABLE) ×34 IMPLANT
KIT ACCESSORY DA VINCI DISP (KITS) ×1
KIT ACCESSORY DVNC DISP (KITS) ×4 IMPLANT
LEGGING LITHOTOMY PAIR STRL (DRAPES) ×5 IMPLANT
NEEDLE HYPO 22GX1.5 SAFETY (NEEDLE) ×2 IMPLANT
NS IRRIG 1000ML POUR BTL (IV SOLUTION) ×5 IMPLANT
OCCLUDER COLPOPNEUMO (BALLOONS) ×2 IMPLANT
PACK LAVH (CUSTOM PROCEDURE TRAY) ×5 IMPLANT
PAD PREP 24X48 CUFFED NSTRL (MISCELLANEOUS) ×10 IMPLANT
PLUG CATH AND CAP STER (CATHETERS) ×5 IMPLANT
PROTECTOR NERVE ULNAR (MISCELLANEOUS) ×10 IMPLANT
SET CYSTO W/LG BORE CLAMP LF (SET/KITS/TRAYS/PACK) ×5 IMPLANT
SET IRRIG TUBING LAPAROSCOPIC (IRRIGATION / IRRIGATOR) ×5 IMPLANT
SOLUTION ELECTROLUBE (MISCELLANEOUS) ×5 IMPLANT
SUT VIC AB 0 CT1 27 (SUTURE) ×10
SUT VIC AB 0 CT1 27XBRD ANBCTR (SUTURE) ×8 IMPLANT
SUT VIC AB 0 CT2 27 (SUTURE) ×8 IMPLANT
SUT VIC AB 2-0 CT1 (SUTURE) IMPLANT
SUT VIC AB 2-0 SH 27 (SUTURE) ×5
SUT VIC AB 2-0 SH 27XBRD (SUTURE) ×1 IMPLANT
SUT VICRYL 0 27 CT2 27 ABS (SUTURE) ×29 IMPLANT
SUT VICRYL 0 UR6 27IN ABS (SUTURE) ×5 IMPLANT
SUT VICRYL RAPIDE 4/0 PS 2 (SUTURE) ×16 IMPLANT
SYR 50ML LL SCALE MARK (SYRINGE) ×5 IMPLANT
TIP UTERINE 6.7X6CM WHT DISP (MISCELLANEOUS) ×2 IMPLANT
TOWEL OR 17X24 6PK STRL BLUE (TOWEL DISPOSABLE) ×15 IMPLANT
TRAY FOLEY CATH 14FR (SET/KITS/TRAYS/PACK) ×5 IMPLANT
TROCAR 12M 150ML BLUNT (TROCAR) ×5 IMPLANT
TROCAR DISP BLADELESS 8 DVNC (TROCAR) ×4 IMPLANT
TROCAR DISP BLADELESS 8MM (TROCAR) ×1
TROCAR XCEL NON-BLD 11X100MML (ENDOMECHANICALS) ×5 IMPLANT
TUBING FILTER THERMOFLATOR (ELECTROSURGICAL) ×5 IMPLANT
WARMER LAPAROSCOPE (MISCELLANEOUS) ×5 IMPLANT
WATER STERILE IRR 1000ML POUR (IV SOLUTION) ×15 IMPLANT

## 2012-04-25 NOTE — Preoperative (Signed)
Beta Blockers   Reason not to administer Beta Blockers:Not Applicable 

## 2012-04-25 NOTE — Anesthesia Postprocedure Evaluation (Signed)
Anesthesia Post Note  Patient: Ashlee Sanford  Procedure(s) Performed: Procedure(s) (LRB): ROBOTIC ASSISTED TOTAL HYSTERECTOMY (N/A) SALPINGO OOPHORECTOMY (Bilateral) ANTERIOR REPAIR (CYSTOCELE) (N/A)  Anesthesia type: General  Patient location: PACU  Post pain: Pain level controlled  Post assessment: Post-op Vital signs reviewed  Last Vitals:  Filed Vitals:   04/25/12 1256  BP:   Pulse: 75  Temp:   Resp: 8    Post vital signs: Reviewed  Level of consciousness: sedated  Complications: No apparent anesthesia complications

## 2012-04-25 NOTE — Addendum Note (Signed)
Addendum  created 04/25/12 1645 by Renford Dills, CRNA   Modules edited:Notes Section

## 2012-04-25 NOTE — H&P (Signed)
Date of Initial H&P: 04/15/2012  History reviewed, patient examined, no change in status, stable for surgery.

## 2012-04-25 NOTE — Anesthesia Postprocedure Evaluation (Signed)
  Anesthesia Post-op Note  Patient: Ashlee Sanford  Procedure(s) Performed: Procedure(s) (LRB) with comments: ROBOTIC ASSISTED TOTAL HYSTERECTOMY (N/A) SALPINGO OOPHORECTOMY (Bilateral) ANTERIOR REPAIR (CYSTOCELE) (N/A)  Patient Location: [facility]  Anesthesia Type:General  Level of Consciousness: awake  Airway and Oxygen Therapy: Patient Spontanous Breathing  Post-op Pain: mild  Post-op Assessment: Patient's Cardiovascular Status Stable and Respiratory Function Stable  Post-op Vital Signs: stable  Complications: No apparent anesthesia complications

## 2012-04-25 NOTE — Anesthesia Preprocedure Evaluation (Signed)
Anesthesia Evaluation  Patient identified by MRN, date of birth, ID band  Reviewed: Allergy & Precautions, H&P , NPO status , Patient's Chart, lab work & pertinent test results, reviewed documented beta blocker date and time   History of Anesthesia Complications (+) PONV  Airway Mallampati: II TM Distance: >3 FB Neck ROM: full    Dental No notable dental hx. (+) Teeth Intact   Pulmonary    Pulmonary exam normal       Cardiovascular negative cardio ROS      Neuro/Psych negative neurological ROS  negative psych ROS   GI/Hepatic negative GI ROS, Neg liver ROS,   Endo/Other  negative endocrine ROS  Renal/GU negative Renal ROS  negative genitourinary   Musculoskeletal negative musculoskeletal ROS (+)   Abdominal Normal abdominal exam  (+)   Peds negative pediatric ROS (+)  Hematology negative hematology ROS (+)   Anesthesia Other Findings   Reproductive/Obstetrics negative OB ROS                           Anesthesia Physical Anesthesia Plan  ASA: II  Anesthesia Plan: General   Post-op Pain Management:    Induction: Intravenous  Airway Management Planned: Oral ETT  Additional Equipment:   Intra-op Plan:   Post-operative Plan: Extubation in OR  Informed Consent: I have reviewed the patients History and Physical, chart, labs and discussed the procedure including the risks, benefits and alternatives for the proposed anesthesia with the patient or authorized representative who has indicated his/her understanding and acceptance.   Dental Advisory Given  Plan Discussed with: CRNA and Surgeon  Anesthesia Plan Comments:         Anesthesia Quick Evaluation

## 2012-04-25 NOTE — Transfer of Care (Signed)
Immediate Anesthesia Transfer of Care Note  Patient: Ashlee Sanford  Procedure(s) Performed: Procedure(s) (LRB) with comments: ROBOTIC ASSISTED TOTAL HYSTERECTOMY (N/A) SALPINGO OOPHORECTOMY (Bilateral) ANTERIOR REPAIR (CYSTOCELE) (N/A)  Patient Location: PACU  Anesthesia Type:General  Level of Consciousness: awake, alert  and oriented  Airway & Oxygen Therapy: Patient Spontanous Breathing and Patient connected to nasal cannula oxygen  Post-op Assessment: Report given to PACU RN and Post -op Vital signs reviewed and stable  Post vital signs: stable  Complications: No apparent anesthesia complications

## 2012-04-25 NOTE — Op Note (Addendum)
04/25/2012  12:57 PM  PATIENT:  Ashlee Sanford  66 y.o. female  PRE-OPERATIVE DIAGNOSIS:  Uterine Prolapse  POST-OPERATIVE DIAGNOSIS:  Uterine Prolapse  PROCEDURE:  Procedure(s) (LRB) with comments: ROBOTIC ASSISTED TOTAL HYSTERECTOMY (N/A) SALPINGO OOPHORECTOMY (Bilateral) ANTERIOR REPAIR (CYSTOCELE) (N/A)  SURGEON:  Surgeon(s) and Role:    * Adon Gehlhausen J. Richardson Dopp, MD - Primary    * Geryl Rankins, MD - Assisting  PHYSICIAN ASSISTANT:   ASSISTANTS: none   ANESTHESIA:   general  EBL:  Total I/O In: 1700 [I.V.:1700] Out: 350 [Urine:250; Blood:100]  BLOOD ADMINISTERED:none  DRAINS: Urinary Catheter (Foley)   LOCAL MEDICATIONS USED:  OTHER ropivicaine   SPECIMEN:  Source of Specimen:  uterus cervix bilateral fallopian tubes and ovaries   DISPOSITION OF SPECIMEN:  PATHOLOGY  COUNTS:  YES  TOURNIQUET:  * No tourniquets in log *  DICTATION: .Dragon Dictation  PLAN OF CARE: Admit for overnight observation  PATIENT DISPOSITION:  PACU - hemodynamically stable.   Delay start of Pharmacological VTE agent (>24hrs) due to surgical blood loss or risk of bleeding: not applicable  Findings: moderate cystocele... Normal uterus fallopian tubes and ovaries.   Indication: This is a 66 year old G0 with menorrhagia due to  uterine fibroids. She desires definitive therapy.   Procedure: The  patient was taken to the operating room where she was placed under general anesthesia. She was placed in dorsal lithotomy position and prepped and draped in the usual sterile fashion. A weighted speculum was placed into the vagina.  A Deaver was placed anteriorly for retraction. The anterior lip of the cervix was grasped with a single-tooth tenaculum. The vaginal mucosa was injected with 2.5 cc of ropivacaine at the 2/4/ 8 and 10 oclock  positions. The uterus was sounded to 6 cm the cervix was dilated to 6 mm . 0 vicryl sutrure  placed at the 12 and 6:00 positions  Of the cervix to facilitate placement of  a Rumi  uterine  manipulator. The manipulator was placed without difficulty. Weighted speculum and Deaver were removed .  Attention was turned to the patient's abdomen where a  12 mm skin incision was made 4 cm above the umbilicus..  A 12 mm trocar was placed under direct visualization . The pneumoperitoneum  was achieved with CO2 gas.  The laparoscope was removed. 60 cc of ropivacaine were injected into the abdominal cavity. The laparoscope  was reinserted. An  8mm incision was made in the right upper quadrant and an 8 mm   trocar was placed 12 centimeters from the umbilicus.later connected to robotic arm #1). An incision was made in there  left upper quadrant TROCAR WAS PLACED 16 cm from the umbilicus. Later connected to robotic arm #2.  Attention was turned to the right upper quadrant where a 11 mm midclavicular assistant  trocar was placed. ( All incision sites were injected with 10cc of ropivicaine prior to port placement. )  Once all ports had been placed under direct visualization.The laparoscope was removed and the da Vinci robotic system was thin right-sided docked.  The robotic  arms were connected to the corresponding trocars as listed above. The laparoscope  was then reinserted.  The PK bipolar cautery was placed into port #1. The monopolar scissor placed in the port #2. All instruments were directed into the pelvis under direct visualization.  Attention  was turned to the surgeons console.. Attention was turned to there left  fallopian tube and ovary . The Left  ureter was identified. The  left   Infundibulopelvic ligament was cauterized with the pK and then excised with laparoscopic scissors. The broad ligament was cauterized with PK incised with scissors. The round ligament was cauterized with the PK incised with scissors. The anterior leaf of broad ligament was incised along the bladder reflection to the midline.  Attention was turned to there right fallopian tube and ovary . The right  ureter was identified. The right  Infundibulopelvic ligament was cauterized with the pK and then excised with laparoscopic scissors. The right broad ligament was cauterized with PK excised scissors. The right round ligament was cauterized with PK and excised with scissors. The   broad ligament was incised  to the midline. The bladder was dissected off the lower uterine segments of the cervix via sharp and blunt dissection.  The uterine arteries were skeletonized bilaterally. They were cauterized with PK and transected. The KOH ring  was identified.  The anterior colpotomy was performed followed by the posterior colpotomy. Once the uterus and cervix were completely excised They were removed through the vagina.   The pk and scissors were removed and log tip forceps were   placed in the port #1 and the cutting needle driver was placed in to port #2. The vaginal cuff angles were closed with figure-of-eight stitches of 0 Vicryl. The remainder of the vaginal cuff was closed with interrupted 0 Vicryl Vicryl figure-of-eight sutures. The pelvis was irrigated.  Pt was noted to have bleeding from the right vaginal cuff angle .Marland Kitchen Hemostasis was obtained with placement of a figure of eight 0 vicryl stitch . Marland Kitchen.Excellent hemostasis was noted. All pelvic pedicles were examined and hemostasis was noted. Interseed was placed along the vaginal cuff. All instuments  removed from the ports.  All ports were removed under direct  Visualization. The  pneumoperitoneum was released.  The fascia of the 12 mm umbilical port was closed with 0 Vicryl and  the fascia the 11 mm port was closed with 0 Vicryl. The skin incisions were closed with 4-0 Vicryl and then covered with Dermabond.   Attention was turned to  The vagina where the vagina cuff was grasped with allis clamps. The anterior vaginal mucosa was injected with 1% lidocaine with epinephrine. The vaginal mucosa was dissected from the bladder. With metzembaum scissors. The fascial  defect was re-approximated with  Interrupted sutures of 0 vicryl. The redundant vaginal mucosa was excised. The vaginal mucosa was then re-approximated with 0 vicryl in a running locked fashion. Excellent hemostasis was noted.  Vaginal packing with estrace cream was placed.    Sponge lap and needle counts were correct x2.  The patient was awakened from anesthesia and taken to the recovery room in stable condition.

## 2012-04-26 ENCOUNTER — Encounter (HOSPITAL_COMMUNITY): Payer: Self-pay | Admitting: Obstetrics and Gynecology

## 2012-04-26 MED ORDER — OXYCODONE-ACETAMINOPHEN 5-325 MG PO TABS: 1.0000 | ORAL_TABLET | Freq: Four times a day (QID) | ORAL | Status: DC | PRN

## 2012-04-26 MED ORDER — INFLUENZA VIRUS VACC SPLIT PF IM SUSP: 0.5000 mL | INTRAMUSCULAR | Status: DC

## 2012-04-26 MED ORDER — IBUPROFEN 600 MG PO TABS: 600.0000 mg | ORAL_TABLET | Freq: Four times a day (QID) | ORAL | Status: DC | PRN

## 2012-04-26 NOTE — Progress Notes (Signed)
Subjective: Patient reports nausea.  Improved. Pain is a 2 out of 10. She is tolerating clears. She has not had breakfast yet. No void yet   Objective: I have reviewed patient's vital signs, intake and output and medications.  General: alert and cooperative GI: soft appropriately tender nondistended + BS no reboud no guarding .. incisions well approximated.  Extremities: extremities normal, atraumatic, no cyanosis or edema   Assessment/Plan: Awaiting bowel and bladder function. May saline  Lock iv if patient tolerates breakfast.  Encourage ambulation.  D/C home later today if voiding and tolerating reg diet   LOS: 1 day    Katherin Ramey J. 04/26/2012, 8:36 AM

## 2012-04-26 NOTE — Progress Notes (Signed)
Pt out in wheelchair  Teaching complete   

## 2012-04-26 NOTE — Progress Notes (Signed)
I received a referral from RN Delorse Lek to see Ashlee Sanford.  She was feeling down because she had been hoping to be at home by now.  Her daughter was in visiting with her and it seems she has good support.  She did not wish to talk further at this time, but she appreciated that we checked in on her.  [address] [place] Pager, [phone] 2:32 PM   04/26/12 1400  Clinical Encounter Type  Visited With Patient and family together  Visit Type Initial  Referral From Nurse

## 2012-12-05 ENCOUNTER — Other Ambulatory Visit: Payer: Self-pay

## 2012-12-05 DIAGNOSIS — Z1231 Encounter for screening mammogram for malignant neoplasm of breast: Secondary | ICD-10-CM

## 2012-12-24 ENCOUNTER — Ambulatory Visit
Admission: RE | Admit: 2012-12-24 | Discharge: 2012-12-24 | Disposition: A | Discharge status: Home / Self Care | Payer: BC Managed Care – PPO | Source: Ambulatory Visit

## 2012-12-24 DIAGNOSIS — Z1231 Encounter for screening mammogram for malignant neoplasm of breast: Secondary | ICD-10-CM

## 2014-01-02 ENCOUNTER — Other Ambulatory Visit: Payer: Self-pay

## 2014-01-02 DIAGNOSIS — Z1231 Encounter for screening mammogram for malignant neoplasm of breast: Secondary | ICD-10-CM

## 2014-01-12 ENCOUNTER — Encounter (INDEPENDENT_AMBULATORY_CARE_PROVIDER_SITE_OTHER): Payer: Self-pay

## 2014-01-12 ENCOUNTER — Ambulatory Visit
Admission: RE | Admit: 2014-01-12 | Discharge: 2014-01-12 | Disposition: A | Discharge status: Home / Self Care | Payer: BC Managed Care – PPO | Source: Ambulatory Visit

## 2014-01-12 DIAGNOSIS — Z1231 Encounter for screening mammogram for malignant neoplasm of breast: Secondary | ICD-10-CM

## 2014-08-10 ENCOUNTER — Other Ambulatory Visit: Payer: Self-pay | Admitting: Obstetrics and Gynecology

## 2014-08-10 ENCOUNTER — Other Ambulatory Visit (HOSPITAL_COMMUNITY)
Admission: RE | Admit: 2014-08-10 | Discharge: 2014-08-10 | Disposition: A | Discharge status: Home / Self Care | Payer: BLUE CROSS/BLUE SHIELD | Source: Ambulatory Visit | Attending: Obstetrics and Gynecology | Admitting: Obstetrics and Gynecology

## 2014-08-10 DIAGNOSIS — Z01419 Encounter for gynecological examination (general) (routine) without abnormal findings: Secondary | ICD-10-CM | POA: Diagnosis present

## 2014-08-10 DIAGNOSIS — Z1151 Encounter for screening for human papillomavirus (HPV): Secondary | ICD-10-CM | POA: Diagnosis present

## 2014-08-20 LAB — CYTOLOGY - PAP

## 2015-02-18 ENCOUNTER — Other Ambulatory Visit: Payer: Self-pay

## 2015-02-18 DIAGNOSIS — Z803 Family history of malignant neoplasm of breast: Secondary | ICD-10-CM

## 2015-02-18 DIAGNOSIS — Z1231 Encounter for screening mammogram for malignant neoplasm of breast: Secondary | ICD-10-CM

## 2015-03-11 ENCOUNTER — Ambulatory Visit: Payer: BLUE CROSS/BLUE SHIELD

## 2015-04-01 ENCOUNTER — Ambulatory Visit
Admission: RE | Admit: 2015-04-01 | Discharge: 2015-04-01 | Disposition: A | Discharge status: Home / Self Care | Payer: BLUE CROSS/BLUE SHIELD | Source: Ambulatory Visit

## 2015-04-01 DIAGNOSIS — Z803 Family history of malignant neoplasm of breast: Secondary | ICD-10-CM

## 2015-04-01 DIAGNOSIS — Z1231 Encounter for screening mammogram for malignant neoplasm of breast: Secondary | ICD-10-CM

## 2015-07-19 ENCOUNTER — Encounter: Payer: Self-pay | Admitting: Internal Medicine

## 2015-09-14 ENCOUNTER — Ambulatory Visit: Payer: BLUE CROSS/BLUE SHIELD | Admitting: Internal Medicine

## 2016-07-17 ENCOUNTER — Other Ambulatory Visit: Payer: Self-pay | Admitting: Family Medicine

## 2016-07-17 DIAGNOSIS — Z1231 Encounter for screening mammogram for malignant neoplasm of breast: Secondary | ICD-10-CM

## 2016-08-08 ENCOUNTER — Ambulatory Visit
Admission: RE | Admit: 2016-08-08 | Discharge: 2016-08-08 | Disposition: A | Discharge status: Home / Self Care | Payer: BLUE CROSS/BLUE SHIELD | Source: Ambulatory Visit | Attending: Family Medicine | Admitting: Family Medicine

## 2016-08-08 DIAGNOSIS — Z1231 Encounter for screening mammogram for malignant neoplasm of breast: Secondary | ICD-10-CM

## 2016-10-22 IMAGING — MG MM SCREENING BREAST TOMO BILATERAL
8 series · 8 of 24 positions shown · non-contrast
Comparison: Previous exam(s).

CLINICAL DATA: Screening.

EXAM:
DIGITAL SCREENING BILATERAL MAMMOGRAM WITH 3D TOMO WITH CAD

[L CC]
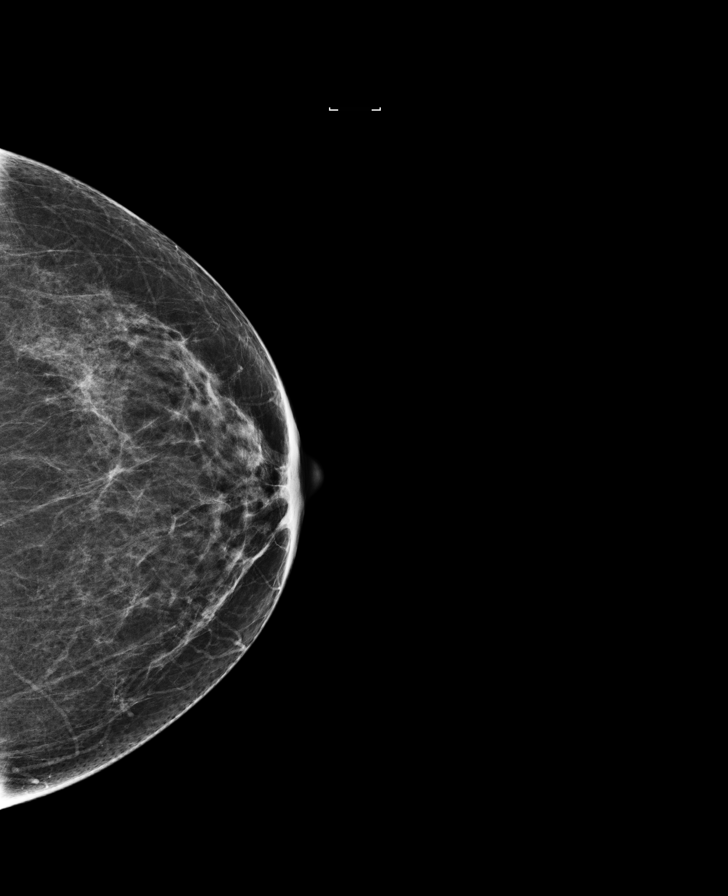

[R MLO]
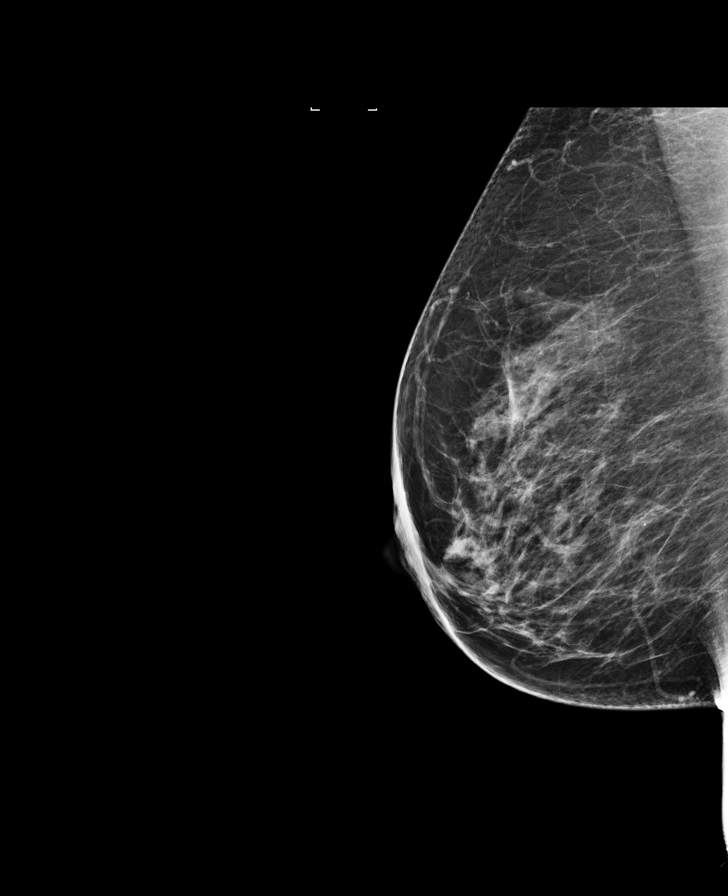

[R CC]
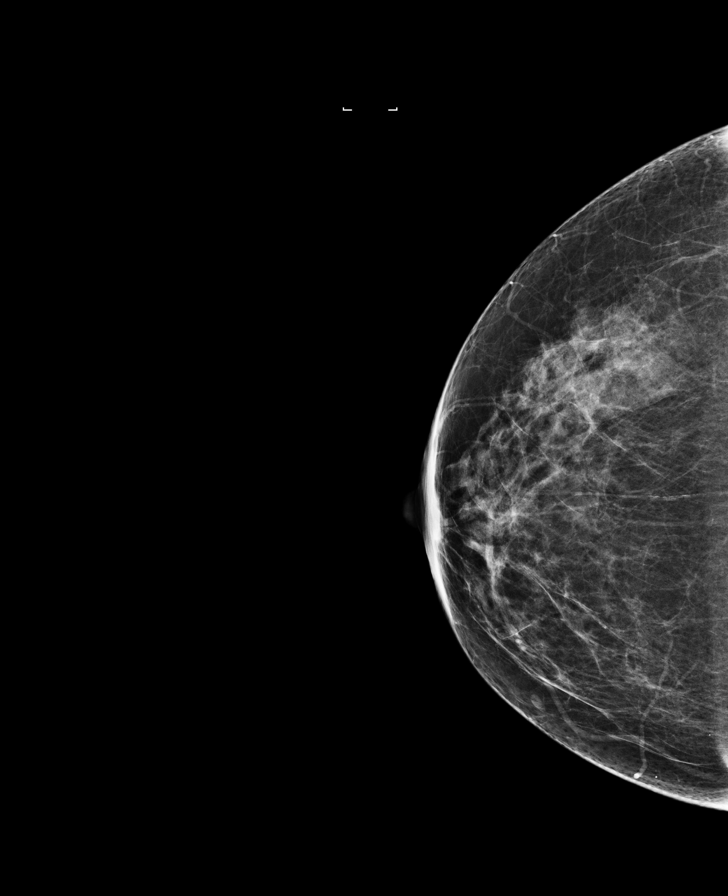

[L MLO]
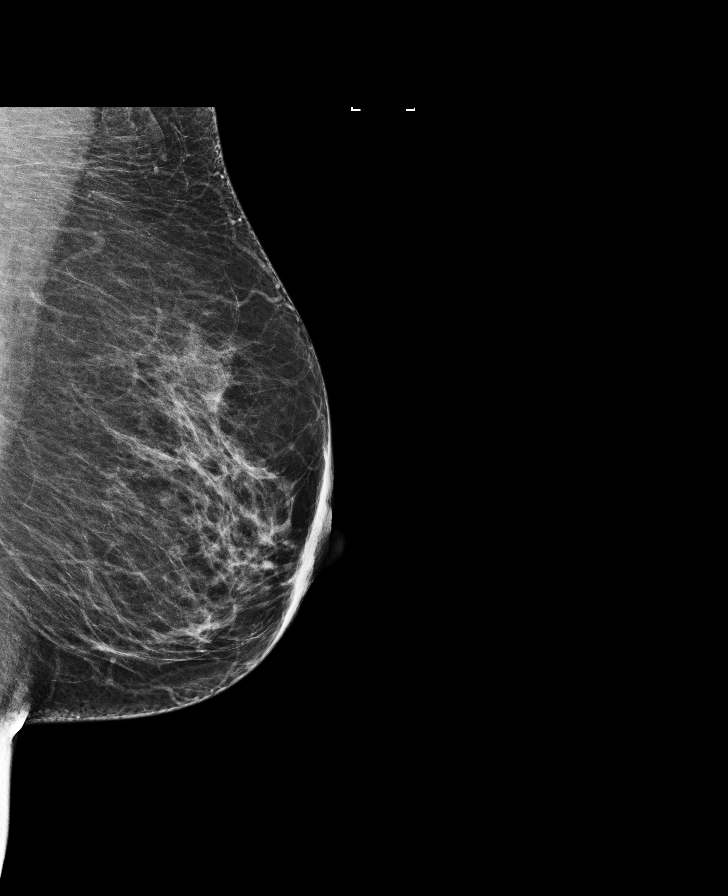

[R CC tomo · tomo slice 31/60.0]
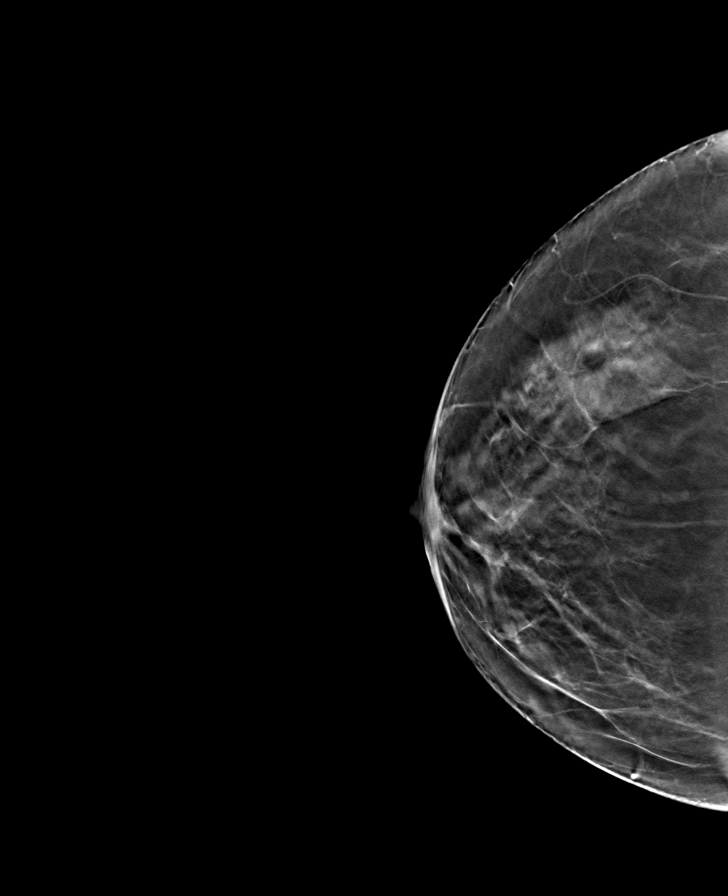

[L MLO tomo · tomo slice 36/71.0]
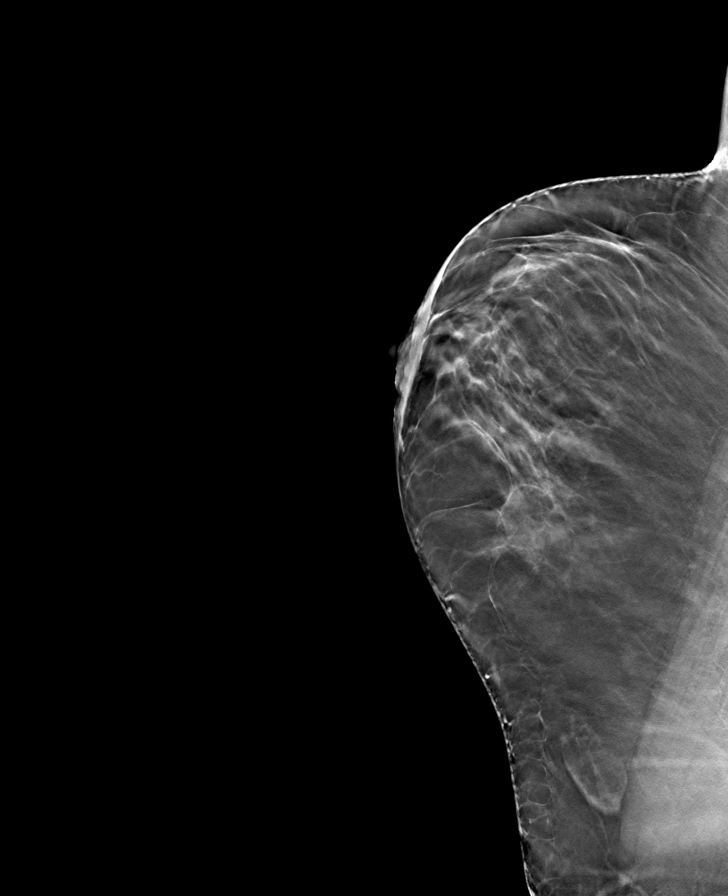

[R MLO tomo · tomo slice 34/67.0]
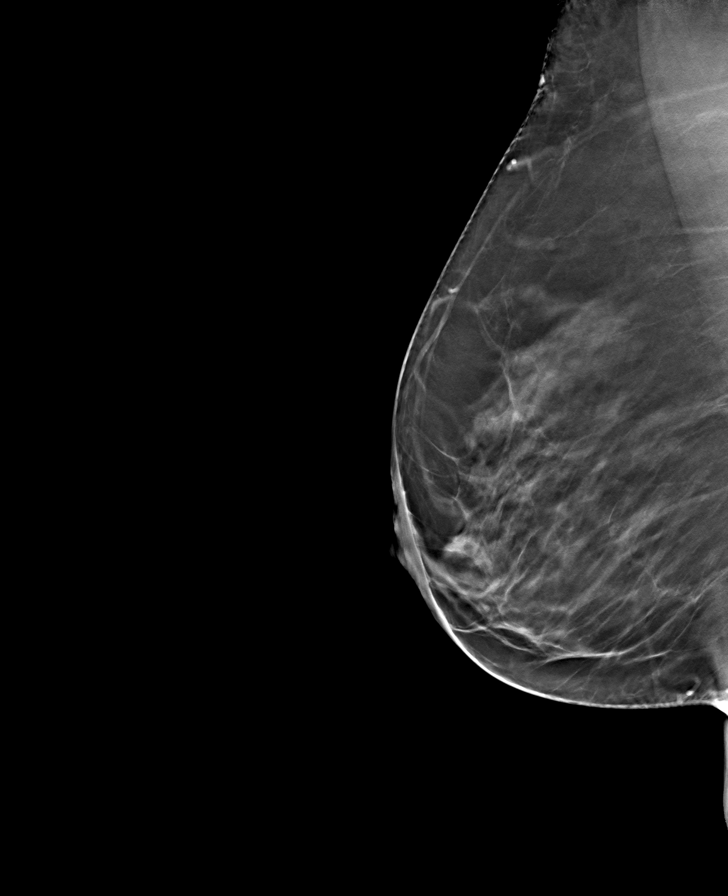

[L CC tomo · tomo slice 30/59.0]
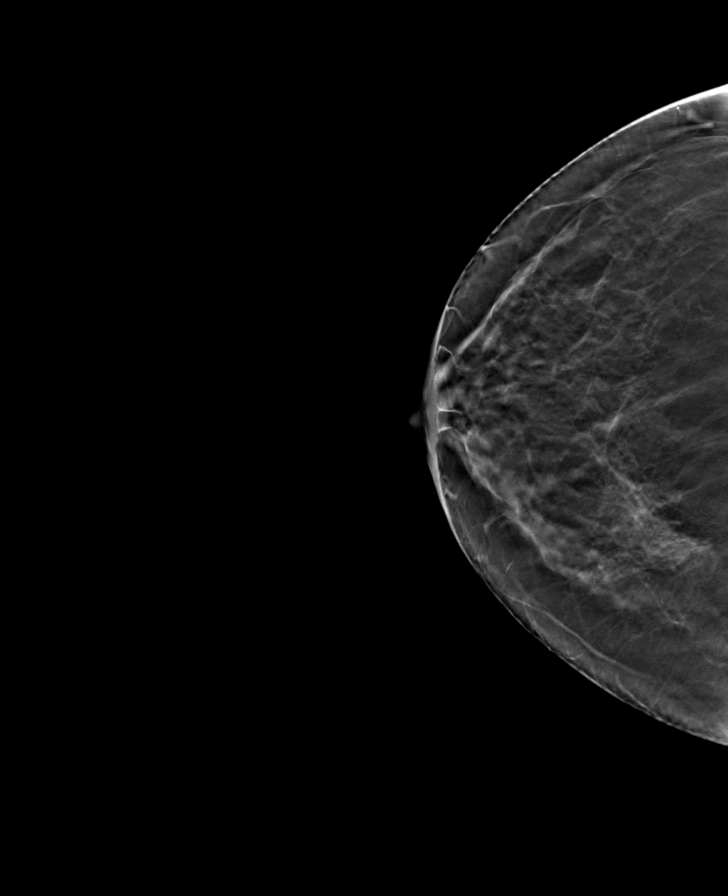

[8 of 24 positions shown; findings below may reference images not displayed]

ACR Breast Density Category b: There are scattered areas of
fibroglandular density.
FINDINGS: There are no findings suspicious for malignancy. Images were
processed with CAD.
IMPRESSION: No mammographic evidence of malignancy. A result letter of this
screening mammogram will be mailed directly to the patient.

RECOMMENDATION:
Screening mammogram in one year. (Code:55-L-23V)

BI-RADS CATEGORY  1: Negative.

## 2018-03-04 ENCOUNTER — Ambulatory Visit: Payer: Self-pay | Admitting: Surgery

## 2018-03-04 NOTE — H&P (Signed)
History of Present Illness Ashlee Sanford. [name] MD; 03/04/2018 11:13 AM) The patient is a 72 year old female who presents with an incisional hernia. Referred by Lennie Odor, PA-C for ventral hernia  This is a 72 year old female status post robotic-assisted TAH/BSO on 04/25/12 for uterine prolapse. She had been doing well over the last 3 months has developed a pulling sensation and some tenderness above her umbilicus. She had a 12 mm port placed at her surgery located 4 cm above the umbilicus. She has noticed some swelling in this area. It is causing more discomfort. She denies any intense pain. She denies any obstructive symptoms. She has not had any imaging of this area. She presents now for evaluation.  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 She would like to wait to have any type of surgery until December if possible.   Past Surgical History Sabino Gasser, CMA; 03/04/2018 10:19 AM) Cataract Surgery Bilateral. Gallbladder Surgery - Laparoscopic Hysterectomy (not due to cancer) - Complete Oral Surgery  Diagnostic Studies History Sabino Gasser, CMA; 03/04/2018 10:19 AM) Colonoscopy >10 years ago Mammogram 1-3 years ago Pap Smear >5 years ago  Allergies Sabino Gasser, [place]; 03/04/2018 10:20 AM) No Known Drug Allergies [03/04/2018]: Allergies Reconciled  Medication History Sabino Gasser, [place]; 03/04/2018 10:20 AM) No Current Medications Medications Reconciled  Social History Sabino Gasser, CMA; 03/04/2018 10:19 AM) Alcohol use Remotely quit alcohol use. Caffeine use Coffee, Tea.  Family History Sabino Gasser, [place]; 03/04/2018 10:19 AM) Breast Cancer Family Members In General. Diabetes Mellitus Family Members In General. Migraine Headache Daughter. Prostate Cancer Family Members In General. Thyroid problems Daughter.  Pregnancy / Birth History Sabino Gasser, CMA; 03/04/2018 10:19 AM) Age at menarche 31 years. Age  of menopause 51-55 Contraceptive History Intrauterine device. Gravida 5 Length (months) of breastfeeding 7-12 Maternal age 42-20 Para 49  Other Problems Sabino Gasser, [place]; 03/04/2018 10:19 AM) Anxiety Disorder Asthma Cholelithiasis Depression General anesthesia - complications Pulmonary Embolism / Blood Clot in Legs     Review of Systems Sabino Gasser CMA; 03/04/2018 10:19 AM) Skin Not Present- Change in Wart/Mole, Dryness, Hives, Jaundice, New Lesions, Non-Healing Wounds, Rash and Ulcer. HEENT Present- Seasonal Allergies. Not Present- Earache, Hearing Loss, Hoarseness, Nose Bleed, Oral Ulcers, Ringing in the Ears, Sinus Pain, Sore Throat, Visual Disturbances, Wears glasses/contact lenses and Yellow Eyes. Respiratory Not Present- Bloody sputum, Chronic Cough, Difficulty Breathing, Snoring and Wheezing. Breast Not Present- Breast Mass, Breast Pain, Nipple Discharge and Skin Changes. Gastrointestinal Present- Bloating. Not Present- Abdominal Pain, Bloody Stool, Change in Bowel Habits, Chronic diarrhea, Constipation, Difficulty Swallowing, Excessive gas, Gets full quickly at meals, Hemorrhoids, Indigestion, Nausea, Rectal Pain and Vomiting. Female Genitourinary Present- Urgency. Not Present- Frequency, Nocturia, Painful Urination and Pelvic Pain. Musculoskeletal Not Present- Back Pain, Joint Pain, Joint Stiffness, Muscle Pain, Muscle Weakness and Swelling of Extremities. Neurological Not Present- Decreased Memory, Fainting, Headaches, Numbness, Seizures, Tingling, Tremor, Trouble walking and Weakness. Psychiatric Not Present- Anxiety, Bipolar, Change in Sleep Pattern, Depression, Fearful and Frequent crying. Endocrine Not Present- Cold Intolerance, Excessive Hunger, Hair Changes, Heat Intolerance, Hot flashes and New Diabetes.  Vitals Sabino Gasser CMA; 03/04/2018 10:20 AM) 03/04/2018 10:20 AM Weight: 159.2 lb Height: 65in Body Surface Area: 1.8 m Body Mass Index:  26.49 kg/m  Temp.: 98.57F  Pulse: 106 (Regular)  BP: 126/74 (Sitting, Left Arm, Standard)      Physical Exam Rodman Key K. Xochil Shanker MD; 03/04/2018 11:14 AM)  The physical exam findings are as follows:  Note:WDWN in NAD Eyes: Pupils equal, round; sclera anicteric HENT: Oral mucosa moist; good dentition Neck: No masses palpated, no thyromegaly Lungs: CTA bilaterally; normal respiratory effort CV: Regular rate and rhythm; no murmurs; extremities well-perfused with no edema Abd: +bowel sounds, soft, non-tender, no palpable organomegaly; healed laparoscopic incisions Striae/ incision above the umbilicus - palpable ventral hernia with Valsalva; spontaneously reduces; small defect ( 2 cm) Skin: Warm, dry; no sign of jaundice Psychiatric - alert and oriented x 4; calm mood and affect    Assessment & Plan Rodman Key K. Shron Ozer MD; 03/04/2018 10:39 AM)  VENTRAL INCISIONAL HERNIA WITHOUT OBSTRUCTION OR GANGRENE (K43.2)  Current Plans Schedule for Surgery - Open repair of ventral incisional hernia with mesh. The surgical procedure has been discussed with the patient. Potential risks, benefits, alternative treatments, and expected outcomes have been explained. All of the patient's questions at this time have been answered. The likelihood of reaching the patient's treatment goal is good. The patient understand the proposed surgical procedure and wishes to proceed.  Ashlee Sanford. Georgette Dover, MD, [facility] Surgery  General/ Trauma Surgery Beeper [phone]  03/04/2018 11:14 AM

## 2018-03-18 ENCOUNTER — Other Ambulatory Visit: Payer: Self-pay | Admitting: Family Medicine

## 2018-03-18 DIAGNOSIS — Z1231 Encounter for screening mammogram for malignant neoplasm of breast: Secondary | ICD-10-CM

## 2018-05-06 ENCOUNTER — Ambulatory Visit
Admission: RE | Admit: 2018-05-06 | Discharge: 2018-05-06 | Disposition: A | Discharge status: Home / Self Care | Payer: BLUE CROSS/BLUE SHIELD | Source: Ambulatory Visit | Attending: Family Medicine | Admitting: Family Medicine

## 2018-05-06 DIAGNOSIS — Z1231 Encounter for screening mammogram for malignant neoplasm of breast: Secondary | ICD-10-CM

## 2018-06-25 DIAGNOSIS — L408 Other psoriasis: Secondary | ICD-10-CM | POA: Diagnosis not present

## 2018-06-25 DIAGNOSIS — L0390 Cellulitis, unspecified: Secondary | ICD-10-CM | POA: Diagnosis not present

## 2018-06-25 DIAGNOSIS — R8290 Unspecified abnormal findings in urine: Secondary | ICD-10-CM | POA: Diagnosis not present

## 2018-06-25 DIAGNOSIS — R599 Enlarged lymph nodes, unspecified: Secondary | ICD-10-CM | POA: Diagnosis not present

## 2018-06-25 DIAGNOSIS — N3090 Cystitis, unspecified without hematuria: Secondary | ICD-10-CM | POA: Diagnosis not present

## 2018-06-25 DIAGNOSIS — Z23 Encounter for immunization: Secondary | ICD-10-CM | POA: Diagnosis not present

## 2018-07-08 ENCOUNTER — Other Ambulatory Visit: Payer: Self-pay | Admitting: Family Medicine

## 2018-07-08 DIAGNOSIS — Z1159 Encounter for screening for other viral diseases: Secondary | ICD-10-CM | POA: Diagnosis not present

## 2018-07-08 DIAGNOSIS — Z1389 Encounter for screening for other disorder: Secondary | ICD-10-CM | POA: Diagnosis not present

## 2018-07-08 DIAGNOSIS — Z0000 Encounter for general adult medical examination without abnormal findings: Secondary | ICD-10-CM | POA: Diagnosis not present

## 2018-07-08 DIAGNOSIS — L219 Seborrheic dermatitis, unspecified: Secondary | ICD-10-CM | POA: Diagnosis not present

## 2018-07-08 DIAGNOSIS — Z13220 Encounter for screening for lipoid disorders: Secondary | ICD-10-CM | POA: Diagnosis not present

## 2018-07-08 DIAGNOSIS — M8580 Other specified disorders of bone density and structure, unspecified site: Secondary | ICD-10-CM

## 2018-07-08 DIAGNOSIS — N3090 Cystitis, unspecified without hematuria: Secondary | ICD-10-CM | POA: Diagnosis not present

## 2018-07-08 DIAGNOSIS — J45909 Unspecified asthma, uncomplicated: Secondary | ICD-10-CM | POA: Diagnosis not present

## 2018-07-08 DIAGNOSIS — R7303 Prediabetes: Secondary | ICD-10-CM | POA: Diagnosis not present

## 2018-08-08 DIAGNOSIS — L821 Other seborrheic keratosis: Secondary | ICD-10-CM | POA: Diagnosis not present

## 2018-08-08 DIAGNOSIS — D2372 Other benign neoplasm of skin of left lower limb, including hip: Secondary | ICD-10-CM | POA: Diagnosis not present

## 2018-08-08 DIAGNOSIS — D2230 Melanocytic nevi of unspecified part of face: Secondary | ICD-10-CM | POA: Diagnosis not present

## 2018-08-08 DIAGNOSIS — D2271 Melanocytic nevi of right lower limb, including hip: Secondary | ICD-10-CM | POA: Diagnosis not present

## 2018-08-08 DIAGNOSIS — D485 Neoplasm of uncertain behavior of skin: Secondary | ICD-10-CM | POA: Diagnosis not present

## 2018-08-08 DIAGNOSIS — L408 Other psoriasis: Secondary | ICD-10-CM | POA: Diagnosis not present

## 2018-08-08 DIAGNOSIS — Z23 Encounter for immunization: Secondary | ICD-10-CM | POA: Diagnosis not present

## 2018-08-08 DIAGNOSIS — Q859 Phakomatosis, unspecified: Secondary | ICD-10-CM | POA: Diagnosis not present

## 2018-08-08 DIAGNOSIS — D225 Melanocytic nevi of trunk: Secondary | ICD-10-CM | POA: Diagnosis not present

## 2018-09-09 ENCOUNTER — Other Ambulatory Visit: Payer: BLUE CROSS/BLUE SHIELD

## 2018-10-08 ENCOUNTER — Other Ambulatory Visit: Payer: BLUE CROSS/BLUE SHIELD

## 2018-10-10 DIAGNOSIS — N3000 Acute cystitis without hematuria: Secondary | ICD-10-CM | POA: Diagnosis not present

## 2018-10-16 DIAGNOSIS — M545 Low back pain: Secondary | ICD-10-CM | POA: Diagnosis not present

## 2018-11-28 ENCOUNTER — Other Ambulatory Visit: Payer: BLUE CROSS/BLUE SHIELD

## 2019-01-28 DIAGNOSIS — H1031 Unspecified acute conjunctivitis, right eye: Secondary | ICD-10-CM | POA: Diagnosis not present

## 2019-02-11 ENCOUNTER — Other Ambulatory Visit: Payer: Self-pay

## 2019-04-18 DIAGNOSIS — B029 Zoster without complications: Secondary | ICD-10-CM | POA: Diagnosis not present

## 2019-04-18 DIAGNOSIS — W57XXXA Bitten or stung by nonvenomous insect and other nonvenomous arthropods, initial encounter: Secondary | ICD-10-CM | POA: Diagnosis not present

## 2019-04-18 DIAGNOSIS — G5712 Meralgia paresthetica, left lower limb: Secondary | ICD-10-CM | POA: Diagnosis not present

## 2019-04-24 ENCOUNTER — Other Ambulatory Visit: Payer: Self-pay

## 2019-07-09 ENCOUNTER — Other Ambulatory Visit: Payer: Self-pay

## 2019-07-21 ENCOUNTER — Other Ambulatory Visit: Payer: Self-pay | Admitting: Family Medicine

## 2019-07-21 DIAGNOSIS — Z1231 Encounter for screening mammogram for malignant neoplasm of breast: Secondary | ICD-10-CM

## 2019-07-21 DIAGNOSIS — M8580 Other specified disorders of bone density and structure, unspecified site: Secondary | ICD-10-CM

## 2019-07-21 DIAGNOSIS — Z1211 Encounter for screening for malignant neoplasm of colon: Secondary | ICD-10-CM | POA: Diagnosis not present

## 2019-07-21 DIAGNOSIS — Z1389 Encounter for screening for other disorder: Secondary | ICD-10-CM | POA: Diagnosis not present

## 2019-07-21 DIAGNOSIS — Z0000 Encounter for general adult medical examination without abnormal findings: Secondary | ICD-10-CM | POA: Diagnosis not present

## 2019-07-21 DIAGNOSIS — R7303 Prediabetes: Secondary | ICD-10-CM | POA: Diagnosis not present

## 2019-07-21 DIAGNOSIS — J45909 Unspecified asthma, uncomplicated: Secondary | ICD-10-CM | POA: Diagnosis not present

## 2019-10-06 ENCOUNTER — Other Ambulatory Visit: Payer: Self-pay

## 2019-10-06 ENCOUNTER — Ambulatory Visit
Admission: RE | Admit: 2019-10-06 | Discharge: 2019-10-06 | Disposition: A | Discharge status: Home / Self Care | Payer: PPO | Source: Ambulatory Visit | Attending: Family Medicine | Admitting: Family Medicine

## 2019-10-06 DIAGNOSIS — Z780 Asymptomatic menopausal state: Secondary | ICD-10-CM | POA: Diagnosis not present

## 2019-10-06 DIAGNOSIS — Z1231 Encounter for screening mammogram for malignant neoplasm of breast: Secondary | ICD-10-CM | POA: Diagnosis not present

## 2019-10-06 DIAGNOSIS — M8580 Other specified disorders of bone density and structure, unspecified site: Secondary | ICD-10-CM

## 2019-10-06 DIAGNOSIS — M85852 Other specified disorders of bone density and structure, left thigh: Secondary | ICD-10-CM | POA: Diagnosis not present

## 2019-12-21 DEATH — deceased

## 2020-01-12 DIAGNOSIS — H903 Sensorineural hearing loss, bilateral: Secondary | ICD-10-CM | POA: Diagnosis not present

## 2020-01-12 DIAGNOSIS — H6063 Unspecified chronic otitis externa, bilateral: Secondary | ICD-10-CM | POA: Diagnosis not present

## 2020-07-05 DIAGNOSIS — H43813 Vitreous degeneration, bilateral: Secondary | ICD-10-CM | POA: Diagnosis not present

## 2020-07-21 DIAGNOSIS — Z1389 Encounter for screening for other disorder: Secondary | ICD-10-CM | POA: Diagnosis not present

## 2020-07-21 DIAGNOSIS — R7303 Prediabetes: Secondary | ICD-10-CM | POA: Diagnosis not present

## 2020-07-21 DIAGNOSIS — C4490 Unspecified malignant neoplasm of skin, unspecified: Secondary | ICD-10-CM | POA: Diagnosis not present

## 2020-07-21 DIAGNOSIS — N644 Mastodynia: Secondary | ICD-10-CM | POA: Diagnosis not present

## 2020-07-21 DIAGNOSIS — Z1211 Encounter for screening for malignant neoplasm of colon: Secondary | ICD-10-CM | POA: Diagnosis not present

## 2020-07-21 DIAGNOSIS — Z0000 Encounter for general adult medical examination without abnormal findings: Secondary | ICD-10-CM | POA: Diagnosis not present

## 2020-07-21 DIAGNOSIS — J45909 Unspecified asthma, uncomplicated: Secondary | ICD-10-CM | POA: Diagnosis not present

## 2020-07-21 DIAGNOSIS — H9190 Unspecified hearing loss, unspecified ear: Secondary | ICD-10-CM | POA: Diagnosis not present

## 2020-09-30 ENCOUNTER — Other Ambulatory Visit: Payer: Self-pay | Admitting: Family Medicine

## 2020-09-30 DIAGNOSIS — Z1231 Encounter for screening mammogram for malignant neoplasm of breast: Secondary | ICD-10-CM

## 2020-11-25 ENCOUNTER — Other Ambulatory Visit: Payer: Self-pay

## 2020-11-25 ENCOUNTER — Ambulatory Visit
Admission: RE | Admit: 2020-11-25 | Discharge: 2020-11-25 | Disposition: A | Discharge status: Home / Self Care | Payer: PPO | Source: Ambulatory Visit | Attending: Family Medicine | Admitting: Family Medicine

## 2020-11-25 DIAGNOSIS — Z1231 Encounter for screening mammogram for malignant neoplasm of breast: Secondary | ICD-10-CM | POA: Diagnosis not present

## 2021-07-07 DIAGNOSIS — H43813 Vitreous degeneration, bilateral: Secondary | ICD-10-CM | POA: Diagnosis not present

## 2021-07-25 DIAGNOSIS — D2262 Melanocytic nevi of left upper limb, including shoulder: Secondary | ICD-10-CM | POA: Diagnosis not present

## 2021-07-25 DIAGNOSIS — D225 Melanocytic nevi of trunk: Secondary | ICD-10-CM | POA: Diagnosis not present

## 2021-07-25 DIAGNOSIS — D485 Neoplasm of uncertain behavior of skin: Secondary | ICD-10-CM | POA: Diagnosis not present

## 2021-07-25 DIAGNOSIS — L570 Actinic keratosis: Secondary | ICD-10-CM | POA: Diagnosis not present

## 2021-07-25 DIAGNOSIS — D2261 Melanocytic nevi of right upper limb, including shoulder: Secondary | ICD-10-CM | POA: Diagnosis not present

## 2021-07-25 DIAGNOSIS — L821 Other seborrheic keratosis: Secondary | ICD-10-CM | POA: Diagnosis not present

## 2021-07-25 DIAGNOSIS — L820 Inflamed seborrheic keratosis: Secondary | ICD-10-CM | POA: Diagnosis not present

## 2021-07-29 DIAGNOSIS — Z0000 Encounter for general adult medical examination without abnormal findings: Secondary | ICD-10-CM | POA: Diagnosis not present

## 2021-07-29 DIAGNOSIS — Z1211 Encounter for screening for malignant neoplasm of colon: Secondary | ICD-10-CM | POA: Diagnosis not present

## 2021-07-29 DIAGNOSIS — H9190 Unspecified hearing loss, unspecified ear: Secondary | ICD-10-CM | POA: Diagnosis not present

## 2021-07-29 DIAGNOSIS — J45909 Unspecified asthma, uncomplicated: Secondary | ICD-10-CM | POA: Diagnosis not present

## 2021-07-29 DIAGNOSIS — C4490 Unspecified malignant neoplasm of skin, unspecified: Secondary | ICD-10-CM | POA: Diagnosis not present

## 2021-07-29 DIAGNOSIS — R7303 Prediabetes: Secondary | ICD-10-CM | POA: Diagnosis not present

## 2021-07-29 DIAGNOSIS — Z1389 Encounter for screening for other disorder: Secondary | ICD-10-CM | POA: Diagnosis not present

## 2021-09-30 DIAGNOSIS — Z1211 Encounter for screening for malignant neoplasm of colon: Secondary | ICD-10-CM | POA: Diagnosis not present

## 2021-10-31 DIAGNOSIS — D485 Neoplasm of uncertain behavior of skin: Secondary | ICD-10-CM | POA: Diagnosis not present

## 2021-10-31 DIAGNOSIS — L820 Inflamed seborrheic keratosis: Secondary | ICD-10-CM | POA: Diagnosis not present

## 2021-10-31 DIAGNOSIS — L570 Actinic keratosis: Secondary | ICD-10-CM | POA: Diagnosis not present

## 2022-01-27 DIAGNOSIS — H43813 Vitreous degeneration, bilateral: Secondary | ICD-10-CM | POA: Diagnosis not present

## 2022-02-23 DIAGNOSIS — U071 COVID-19: Secondary | ICD-10-CM | POA: Diagnosis not present

## 2022-02-23 DIAGNOSIS — J45909 Unspecified asthma, uncomplicated: Secondary | ICD-10-CM | POA: Diagnosis not present

## 2022-04-19 DIAGNOSIS — L820 Inflamed seborrheic keratosis: Secondary | ICD-10-CM | POA: Diagnosis not present

## 2022-04-19 DIAGNOSIS — L578 Other skin changes due to chronic exposure to nonionizing radiation: Secondary | ICD-10-CM | POA: Diagnosis not present

## 2022-04-19 DIAGNOSIS — D485 Neoplasm of uncertain behavior of skin: Secondary | ICD-10-CM | POA: Diagnosis not present

## 2022-04-19 DIAGNOSIS — Z85828 Personal history of other malignant neoplasm of skin: Secondary | ICD-10-CM | POA: Diagnosis not present

## 2022-04-19 DIAGNOSIS — I781 Nevus, non-neoplastic: Secondary | ICD-10-CM | POA: Diagnosis not present

## 2022-04-19 DIAGNOSIS — Z08 Encounter for follow-up examination after completed treatment for malignant neoplasm: Secondary | ICD-10-CM | POA: Diagnosis not present

## 2022-06-27 DIAGNOSIS — L538 Other specified erythematous conditions: Secondary | ICD-10-CM | POA: Diagnosis not present

## 2022-06-27 DIAGNOSIS — D225 Melanocytic nevi of trunk: Secondary | ICD-10-CM | POA: Diagnosis not present

## 2022-06-27 DIAGNOSIS — L820 Inflamed seborrheic keratosis: Secondary | ICD-10-CM | POA: Diagnosis not present

## 2022-06-27 DIAGNOSIS — Z85828 Personal history of other malignant neoplasm of skin: Secondary | ICD-10-CM | POA: Diagnosis not present

## 2022-06-27 DIAGNOSIS — L570 Actinic keratosis: Secondary | ICD-10-CM | POA: Diagnosis not present

## 2022-06-27 DIAGNOSIS — D2261 Melanocytic nevi of right upper limb, including shoulder: Secondary | ICD-10-CM | POA: Diagnosis not present

## 2022-06-27 DIAGNOSIS — R208 Other disturbances of skin sensation: Secondary | ICD-10-CM | POA: Diagnosis not present

## 2022-06-27 DIAGNOSIS — X32XXXA Exposure to sunlight, initial encounter: Secondary | ICD-10-CM | POA: Diagnosis not present

## 2022-06-27 DIAGNOSIS — D2262 Melanocytic nevi of left upper limb, including shoulder: Secondary | ICD-10-CM | POA: Diagnosis not present

## 2022-08-03 ENCOUNTER — Other Ambulatory Visit: Payer: Self-pay | Admitting: Internal Medicine

## 2022-08-03 DIAGNOSIS — M8580 Other specified disorders of bone density and structure, unspecified site: Secondary | ICD-10-CM

## 2022-09-18 ENCOUNTER — Ambulatory Visit
Admission: RE | Admit: 2022-09-18 | Discharge: 2022-09-18 | Disposition: A | Discharge status: Home / Self Care | Payer: PPO | Source: Ambulatory Visit | Attending: Internal Medicine | Admitting: Internal Medicine

## 2022-09-18 DIAGNOSIS — M8580 Other specified disorders of bone density and structure, unspecified site: Secondary | ICD-10-CM

## 2022-09-21 ENCOUNTER — Other Ambulatory Visit: Payer: Self-pay | Admitting: Internal Medicine

## 2022-09-21 DIAGNOSIS — R928 Other abnormal and inconclusive findings on diagnostic imaging of breast: Secondary | ICD-10-CM

## 2022-09-28 ENCOUNTER — Ambulatory Visit
Admission: RE | Admit: 2022-09-28 | Discharge: 2022-09-28 | Disposition: A | Discharge status: Home / Self Care | Payer: PPO | Source: Ambulatory Visit | Attending: Internal Medicine | Admitting: Internal Medicine

## 2022-09-28 ENCOUNTER — Other Ambulatory Visit: Payer: Self-pay | Admitting: Internal Medicine

## 2022-09-28 DIAGNOSIS — R921 Mammographic calcification found on diagnostic imaging of breast: Secondary | ICD-10-CM

## 2022-09-28 DIAGNOSIS — R928 Other abnormal and inconclusive findings on diagnostic imaging of breast: Secondary | ICD-10-CM

## 2022-09-30 ENCOUNTER — Ambulatory Visit
Admission: RE | Admit: 2022-09-30 | Discharge: 2022-09-30 | Disposition: A | Discharge status: Home / Self Care | Payer: PPO | Source: Ambulatory Visit | Attending: Internal Medicine | Admitting: Internal Medicine

## 2022-09-30 DIAGNOSIS — R921 Mammographic calcification found on diagnostic imaging of breast: Secondary | ICD-10-CM

## 2022-09-30 DIAGNOSIS — R928 Other abnormal and inconclusive findings on diagnostic imaging of breast: Secondary | ICD-10-CM

## 2022-09-30 HISTORY — PX: BREAST BIOPSY: SHX20

## 2022-10-04 ENCOUNTER — Telehealth: Payer: Self-pay | Admitting: Hematology and Oncology

## 2022-10-04 NOTE — Telephone Encounter (Signed)
Spoke to patient to confirm upcoming afternoon [facility] clinic appointment on 5/22, paperwork will be sent via e-mail.   Gave location and time, also informed patient that the surgeon's office would be calling as well to get information from them similar to the packet that they will be receiving so make sure to do both.  Reminded patient that all providers will be coming to the clinic to see them HERE and if they had any questions to not hesitate to reach back out to myself or their navigators. 

## 2022-10-09 ENCOUNTER — Encounter: Payer: Self-pay | Admitting: *Deleted

## 2022-10-09 DIAGNOSIS — D0511 Intraductal carcinoma in situ of right breast: Secondary | ICD-10-CM | POA: Insufficient documentation

## 2022-10-10 NOTE — Progress Notes (Signed)
Radiation Oncology         [phone] ________________________________  Multidisciplinary Breast Oncology Clinic [facility]) Initial Outpatient Consultation  Name: Ashlee Sanford MRN: [id number]  Date: 10/11/2022  DOB: 1946/05/11  IR:CVELFY, Valetta Mole, MD  Harriette Bouillon, MD   REFERRING PHYSICIAN: Harriette Bouillon, MD  DIAGNOSIS: There were no encounter diagnoses.  Stage 0 (cTis (DCIS), cN0, cM0) Right Breast, UOQ, Intermediate grade DCIS, ER+ / PR+ / Her2 not assessed  No diagnosis found.  HISTORY OF PRESENT ILLNESS::Ashlee Sanford is a 77 y.o. female who is presenting to the office today for evaluation of her newly diagnosed breast cancer. She is accompanied by ***. She is doing well overall.   She had routine screening mammography on 09/18/22 showing a possible abnormality in the right breast. She underwent right breast diagnostic mammography with tomography at [facility] on 09/28/22 which further revealed a 1.8 cm group of indeterminate upper outer right breast calcifications.  Biopsy of the upper outer right breast calcs on 09/30/22 showed: intermediate grade DCIS measuring 4 mm in the greatest linear extent of the sample with necrosis. Prognostic indicators significant for: estrogen receptor, 100% positive and progesterone receptor, 95% positive, both with strong staining intensity. Her2 not assessed.  Menarche: *** years old Age at first live birth: *** years old GP: *** LMP: *** Contraceptive: *** HRT: ***   The patient was referred today for presentation in the multidisciplinary conference.  Radiology studies and pathology slides were presented there for review and discussion of treatment options.  A consensus was discussed regarding potential next steps.  PREVIOUS RADIATION THERAPY: {EXAM; YES/NO:19492::"No"}  PAST MEDICAL HISTORY:  Past Medical History:  Diagnosis Date   Asthma    no inhaler   PONV (postoperative nausea and vomiting)     PAST SURGICAL  HISTORY: Past Surgical History:  Procedure Laterality Date   BREAST BIOPSY Right 09/30/2022   MM RT BREAST BX W LOC DEV 1ST LESION IMAGE BX SPEC STEREO GUIDE 09/30/2022 [facility] MAMMOGRAP[facility]   CATARACT EXTRACTION     bilateral   CHOLECYSTECTOMY     CYSTOCELE REPAIR  04/25/2012   Procedure: ANTERIOR REPAIR (CYSTOCELE);  Surgeon: Dorien Chihuahua. Richardson Dopp, MD;  Location: [facility];  Service: Gynecology;  Laterality: N/A;   DILATION AND CURETTAGE OF UTERUS     ROBOTIC ASSISTED TOTAL HYSTERECTOMY  04/25/2012   Procedure: ROBOTIC ASSISTED TOTAL HYSTERECTOMY;  Surgeon: Dorien Chihuahua. Richardson Dopp, MD;  Location: [facility];  Service: Gynecology;  Laterality: N/A;   SALPINGOOPHORECTOMY  04/25/2012   Procedure: SALPINGO OOPHORECTOMY;  Surgeon: Dorien Chihuahua. Richardson Dopp, MD;  Location: [facility];  Service: Gynecology;  Laterality: Bilateral;   TONSILLECTOMY     TUBAL LIGATION     WRIST SURGERY  2008    FAMILY HISTORY:  Family History  Problem Relation Age of Onset   Breast cancer Father    Breast cancer Maternal Aunt    Breast cancer Cousin     SOCIAL HISTORY:  Social History   Socioeconomic History   Marital status: Single    Spouse name: Not on file   Number of children: Not on file   Years of education: Not on file   Highest education level: Not on file  Occupational History   Not on file  Tobacco Use   Smoking status: Never   Smokeless tobacco: Not on file  Substance and Sexual Activity   Alcohol use: No   Drug use: No   Sexual activity: Not on file  Other Topics Concern  Not on file  Social History Narrative   Not on file   Social Determinants of Health   Financial Resource Strain: Not on file  Food Insecurity: Not on file  Transportation Needs: Not on file  Physical Activity: Not on file  Stress: Not on file  Social Connections: Not on file    ALLERGIES:  Allergies  Allergen Reactions   Meprobamate Other (See Comments)    Forgets to breathe   Other Other (See Comments)    Patient is allergic to low molecular  iron   Penicillins Other (See Comments)    Told never to take it based on allergy test as a child   Tiagabine Other (See Comments)    Loss of consciousness    MEDICATIONS:  Current Outpatient Medications  Medication Sig Dispense Refill   ibuprofen (ADVIL,MOTRIN) 600 MG tablet Take 1 tablet (600 mg total) by mouth every 6 (six) hours as needed (mild pain). 30 tablet 1   oxyCODONE-acetaminophen (PERCOCET/ROXICET) 5-325 MG per tablet Take 1-2 tablets by mouth every 6 (six) hours as needed (moderate to severe pain (when tolerating fluids)). 30 tablet 0   Polyethyl Glycol-Propyl Glycol (SYSTANE OP) Apply 1 drop to eye as needed. For dry eyes     No current facility-administered medications for this encounter.    REVIEW OF SYSTEMS: A 10+ POINT REVIEW OF SYSTEMS WAS OBTAINED including neurology, dermatology, psychiatry, cardiac, respiratory, lymph, extremities, GI, GU, musculoskeletal, constitutional, reproductive, HEENT. On the provided form, she reports ***. She denies *** and any other symptoms.    PHYSICAL EXAM:  vitals were not taken for this visit.  {may need to copy over vitals} Lungs are clear to auscultation bilaterally. Heart has regular rate and rhythm. No palpable cervical, supraclavicular, or axillary adenopathy. Abdomen soft, non-tender, normal bowel sounds. Breast: *** breast with no palpable mass, nipple discharge, or bleeding. *** breast with ***.   KPS = ***  100 - Normal; no complaints; no evidence of disease. 90   - Able to carry on normal activity; minor signs or symptoms of disease. 80   - Normal activity with effort; some signs or symptoms of disease. 24   - Cares for self; unable to carry on normal activity or to do active work. 60   - Requires occasional assistance, but is able to care for most of his personal needs. 50   - Requires considerable assistance and frequent medical care. 40   - Disabled; requires special care and assistance. 30   - Severely disabled;  hospital admission is indicated although death not imminent. 20   - Very sick; hospital admission necessary; active supportive treatment necessary. 10   - Moribund; fatal processes progressing rapidly. 0     - Dead  Karnofsky DA, [facility], Craver LS and Burchenal [facility] [phone]) The use of the nitrogen mustards in the palliative treatment of carcinoma: with particular reference to bronchogenic carcinoma Cancer 1 [phone]  LABORATORY DATA:  Lab Results  Component Value Date   WBC 8.6 04/22/2012   HGB 10.7 (L) 04/26/2012   HCT 31.6 (L) 04/26/2012   MCV 88.0 04/22/2012   PLT 233 04/22/2012   Lab Results  Component Value Date   NA 137 04/22/2012   K 3.9 04/22/2012   CL 99 04/22/2012   CO2 28 04/22/2012   Lab Results  Component Value Date   ALT 15 04/22/2012   AST 21 04/22/2012   ALKPHOS 95 04/22/2012   BILITOT 0.3 04/22/2012    PULMONARY FUNCTION  TEST:   Review Flowsheet        No data to display          RADIOGRAPHY: MM RT BREAST BX W LOC DEV 1ST LESION IMAGE BX SPEC STEREO GUIDE  Addendum Date: 10/03/2022   ADDENDUM REPORT: 10/03/2022 14:31 ADDENDUM: PATHOLOGY revealed: Site breast, RIGHT, needle core biopsy, upper outer DUCTAL CARCINOMA IN SITU, INTERMEDIATE GRADE NECROSIS: PRESENT CALCIFICATIONS: PRESENT DCIS LENGTH: 0.4 CM SEE NOTE Pathology results are CONCORDANT with imaging findings, per Dr. Annia Belt. Pathology results and recommendations below were discussed with patient by telephone on 10/03/2022. Patient reported biopsy site within normal limits with slight tenderness at the site. Post biopsy care instructions were reviewed, questions were answered and my direct phone number was provided to patient. Patient was instructed to call [facility] if any concerns or questions arise related to the biopsy. The patient was referred to the [facility] at [facility] with appointment on 10/11/2022.  Pathology results reported by Lynett Grimes, RN on 10/03/2022. Electronically Signed   By: Annia Belt M.D.   On: 10/03/2022 14:31   Result Date: 10/03/2022 CLINICAL DATA:  Indeterminate calcifications upper-outer right breast EXAM: RIGHT BREAST STEREOTACTIC CORE NEEDLE BIOPSY COMPARISON:  Previous exam(s). FINDINGS: The patient and I discussed the procedure of stereotactic-guided biopsy including benefits and alternatives. We discussed the high likelihood of a successful procedure. We discussed the risks of the procedure including infection, bleeding, tissue injury, clip migration, and inadequate sampling. Informed written consent was given. The usual time out protocol was performed immediately prior to the procedure. Using sterile technique and 1% Lidocaine as local anesthetic, under stereotactic guidance, a 9 gauge vacuum assisted device was used to perform core needle biopsy of calcifications upper-outer right breast using a cranial approach. Specimen radiograph was performed showing calcifications. Specimens with calcifications are identified for pathology. Lesion quadrant: Upper outer quadrant At the conclusion of the procedure, coil shaped tissue marker clip was deployed into the biopsy cavity. Follow-up 2-view mammogram was performed and dictated separately. IMPRESSION: Stereotactic-guided biopsy of right breast calcifications upper-outer quadrant. No apparent complications. Electronically Signed: By: Annia Belt M.D. On: 09/30/2022 07:18  MM CLIP PLACEMENT RIGHT  Result Date: 09/30/2022 CLINICAL DATA:  Status post stereo biopsy right breast calcifications. EXAM: 3D DIAGNOSTIC RIGHT MAMMOGRAM POST STEREOTACTIC BIOPSY COMPARISON:  Previous exam(s). FINDINGS: 3D Mammographic images were obtained following stereotactic guided biopsy of right breast calcifications. The biopsy marking clip is in expected position at the site of biopsy. IMPRESSION: Appropriate positioning of the coil shaped biopsy marking clip  at the site of biopsy in the upper-outer right breast. Final Assessment: Post Procedure Mammograms for Marker Placement Electronically Signed   By: Annia Belt M.D.   On: 09/30/2022 07:25  MM Digital Diagnostic Unilat R  Result Date: 09/28/2022 CLINICAL DATA:  77 year old female presents for further evaluation of RIGHT breast calcifications identified on screening mammogram. EXAM: DIGITAL DIAGNOSTIC UNILATERAL RIGHT MAMMOGRAM TECHNIQUE: Right digital diagnostic mammography was performed. COMPARISON:  Previous exam(s). ACR Breast Density Category b: There are scattered areas of fibroglandular density. FINDINGS: Full field and magnification views of the RIGHT breast demonstrate a 1.8 cm group of pleomorphic calcifications within the UPPER OUTER RIGHT breast. IMPRESSION: 1.8 cm group of indeterminate UPPER-OUTER RIGHT breast calcifications. Tissue sampling is recommended. RECOMMENDATION: Stereotactic guided RIGHT breast biopsy, which will be scheduled. I have discussed the findings and recommendations with the patient. If applicable, a reminder letter will be  sent to the patient regarding the next appointment. BI-RADS CATEGORY  4: Suspicious. Electronically Signed   By: Harmon Pier M.D.   On: 09/28/2022 14:32  MM 3D SCREENING MAMMOGRAM BILATERAL BREAST  Result Date: 09/20/2022 CLINICAL DATA:  Screening. EXAM: DIGITAL SCREENING BILATERAL MAMMOGRAM WITH TOMOSYNTHESIS AND CAD TECHNIQUE: Bilateral screening digital craniocaudal and mediolateral oblique mammograms were obtained. Bilateral screening digital breast tomosynthesis was performed. The images were evaluated with computer-aided detection. COMPARISON:  Previous exam(s). ACR Breast Density Category b: There are scattered areas of fibroglandular density. FINDINGS: In the right breast, calcifications warrant further evaluation with magnified views. In the left breast, no findings suspicious for malignancy. IMPRESSION: Further evaluation is suggested for  calcifications in the right breast. RECOMMENDATION: Diagnostic mammogram of the right breast. (Code:[id number]-40M) The patient will be contacted regarding the findings, and additional imaging will be scheduled. BI-RADS CATEGORY  0: Incomplete: Need additional imaging evaluation. Electronically Signed   By: Jacob Moores M.D.   On: 09/20/2022 12:52      IMPRESSION: *** {DIAGNOSIS HERE}  Patient will be a good candidate for breast conservation with radiotherapy to the right breast. We discussed the general course of radiation, potential side effects, and toxicities with radiation and the patient is interested in this approach. ***   PLAN:  *** (copy notes from board at conference)   ------------------------------------------------  Billie Lade, PhD, MD  This document serves as a record of services personally performed by Antony Blackbird, MD. It was created on his behalf by Neena Rhymes, a trained medical scribe. The creation of this record is based on the scribe's personal observations and the provider's statements to them. This document has been checked and approved by the attending provider.

## 2022-10-11 ENCOUNTER — Ambulatory Visit
Admission: RE | Admit: 2022-10-11 | Discharge: 2022-10-11 | Disposition: A | Discharge status: Home / Self Care | Payer: PPO | Source: Ambulatory Visit | Attending: Radiation Oncology | Admitting: Radiation Oncology

## 2022-10-11 ENCOUNTER — Encounter: Payer: Self-pay | Admitting: General Practice

## 2022-10-11 ENCOUNTER — Inpatient Hospital Stay: Payer: PPO | Attending: Hematology and Oncology

## 2022-10-11 ENCOUNTER — Ambulatory Visit: Payer: Self-pay | Admitting: Surgery

## 2022-10-11 ENCOUNTER — Ambulatory Visit: Payer: PPO | Admitting: Physical Therapy

## 2022-10-11 ENCOUNTER — Inpatient Hospital Stay (HOSPITAL_BASED_OUTPATIENT_CLINIC_OR_DEPARTMENT_OTHER): Payer: PPO | Admitting: Genetic Counselor

## 2022-10-11 ENCOUNTER — Inpatient Hospital Stay (HOSPITAL_BASED_OUTPATIENT_CLINIC_OR_DEPARTMENT_OTHER): Payer: PPO | Admitting: Hematology and Oncology

## 2022-10-11 ENCOUNTER — Other Ambulatory Visit: Payer: Self-pay

## 2022-10-11 ENCOUNTER — Encounter: Payer: Self-pay | Admitting: *Deleted

## 2022-10-11 ENCOUNTER — Encounter: Payer: Self-pay | Admitting: Genetic Counselor

## 2022-10-11 VITALS — BP 153/82 | HR 94 | Temp 98.1°F | Resp 17 | Wt 151.0 lb

## 2022-10-11 DIAGNOSIS — D0511 Intraductal carcinoma in situ of right breast: Secondary | ICD-10-CM

## 2022-10-11 DIAGNOSIS — Z803 Family history of malignant neoplasm of breast: Secondary | ICD-10-CM | POA: Insufficient documentation

## 2022-10-11 DIAGNOSIS — Z8042 Family history of malignant neoplasm of prostate: Secondary | ICD-10-CM

## 2022-10-11 LAB — CMP (CANCER CENTER ONLY)
ALT: 11 U/L (ref 0–44)
AST: 15 U/L (ref 15–41)
Albumin: 4.6 g/dL (ref 3.5–5.0)
Alkaline Phosphatase: 85 U/L (ref 38–126)
Anion gap: 5 (ref 5–15)
BUN: 10 mg/dL (ref 8–23)
CO2: 28 mmol/L (ref 22–32)
Calcium: 9.5 mg/dL (ref 8.9–10.3)
Chloride: 103 mmol/L (ref 98–111)
Creatinine: 0.69 mg/dL (ref 0.44–1.00)
GFR, Estimated: >60 mL/min (ref >60)
Glucose, Bld: 131 mg/dL — ABNORMAL HIGH (ref 70–99)
Potassium: 3.9 mmol/L (ref 3.5–5.1)
Sodium: 136 mmol/L (ref 135–145)
Total Bilirubin: 0.5 mg/dL (ref 0.3–1.2)
Total Protein: 7.4 g/dL (ref 6.5–8.1)

## 2022-10-11 LAB — CBC WITH DIFFERENTIAL (CANCER CENTER ONLY)
Abs Immature Granulocytes: 0.02 10*3/uL (ref 0.00–0.07)
Basophils Absolute: 0 10*3/uL (ref 0.0–0.1)
Basophils Relative: 0 %
Eosinophils Absolute: 0.1 10*3/uL (ref 0.0–0.5)
Eosinophils Relative: 1 %
HCT: 39.2 % (ref 36.0–46.0)
Hemoglobin: 13.7 g/dL (ref 12.0–15.0)
Immature Granulocytes: 0 %
Lymphocytes Relative: 34 %
Lymphs Abs: 2.6 10*3/uL (ref 0.7–4.0)
MCH: 30.2 pg (ref 26.0–34.0)
MCHC: 34.9 g/dL (ref 30.0–36.0)
MCV: 86.3 fL (ref 80.0–100.0)
Monocytes Absolute: 0.4 10*3/uL (ref 0.1–1.0)
Monocytes Relative: 6 %
Neutro Abs: 4.5 10*3/uL (ref 1.7–7.7)
Neutrophils Relative %: 59 %
Platelet Count: 231 10*3/uL (ref 150–400)
RBC: 4.54 MIL/uL (ref 3.87–5.11)
RDW: 12.1 % (ref 11.5–15.5)
WBC Count: 7.7 10*3/uL (ref 4.0–10.5)
nRBC: 0 % (ref 0.0–0.2)

## 2022-10-11 LAB — GENETIC SCREENING ORDER

## 2022-10-11 NOTE — Progress Notes (Signed)
[facility] CONSULT NOTE  Patient Care Team: Lindley Magnus, MD as PCP - General Serena Croissant, MD as Consulting Physician (Hematology and Oncology) Harriette Bouillon, MD as Consulting Physician (General Surgery) Antony Blackbird, MD as Consulting Physician (Radiation Oncology) Pershing Proud, RN as Oncology Nurse Navigator Donnelly Angelica, RN as Oncology Nurse Navigator  CHIEF COMPLAINTS/PURPOSE OF CONSULTATION:  Newly diagnosed right breast DCIS  HISTORY OF PRESENTING ILLNESS:  Ashlee Sanford 77 y.o. female is here because of recent diagnosis of right breast DCIS.  Patient had routine screening mammogram that detected right breast calcification measuring 1.8 cm.  Biopsy revealed intermediate grade DCIS that was ER/PR positive.  She was presented's morning of the multidisciplinary tumor board and she is here today accompanied by her son to discuss treatment plan.  She has extensive family history of breast cancer involving her mother father and cousin  I reviewed her records extensively and collaborated the history with the patient.  SUMMARY OF ONCOLOGIC HISTORY: Oncology History  Ductal carcinoma in situ (DCIS) of right breast  09/30/2022 Initial Diagnosis   Screening mammogram detected right breast calcifications UOQ 1.8 cm: Intermediate grade DCIS with necrosis ER 100% PR 95%   10/11/2022 Cancer Staging   Staging form: Breast, AJCC 8th Edition - Clinical: Stage 0 (cTis (DCIS), cN0, cM0, G2, ER+, PR+, HER2: Not Assessed) - Signed by Serena Croissant, MD on 10/11/2022 Stage prefix: Initial diagnosis Histologic grading system: 3 grade system      MEDICAL HISTORY:  Past Medical History:  Diagnosis Date   Asthma    no inhaler   PONV (postoperative nausea and vomiting)     SURGICAL HISTORY: Past Surgical History:  Procedure Laterality Date   BREAST BIOPSY Right 09/30/2022   MM RT BREAST BX W LOC DEV 1ST LESION IMAGE BX SPEC STEREO GUIDE 09/30/2022 [facility]    CATARACT EXTRACTION     bilateral   CHOLECYSTECTOMY     CYSTOCELE REPAIR  04/25/2012   Procedure: ANTERIOR REPAIR (CYSTOCELE);  Surgeon: Dorien Chihuahua. Richardson Dopp, MD;  Location: [facility];  Service: Gynecology;  Laterality: N/A;   DILATION AND CURETTAGE OF UTERUS     ROBOTIC ASSISTED TOTAL HYSTERECTOMY  04/25/2012   Procedure: ROBOTIC ASSISTED TOTAL HYSTERECTOMY;  Surgeon: Dorien Chihuahua. Richardson Dopp, MD;  Location: [facility];  Service: Gynecology;  Laterality: N/A;   SALPINGOOPHORECTOMY  04/25/2012   Procedure: SALPINGO OOPHORECTOMY;  Surgeon: Dorien Chihuahua. Richardson Dopp, MD;  Location: [facility];  Service: Gynecology;  Laterality: Bilateral;   TONSILLECTOMY     TUBAL LIGATION     WRIST SURGERY  2008    SOCIAL HISTORY: Social History   Socioeconomic History   Marital status: Single    Spouse name: Not on file   Number of children: Not on file   Years of education: Not on file   Highest education level: Not on file  Occupational History   Not on file  Tobacco Use   Smoking status: Never   Smokeless tobacco: Not on file  Substance and Sexual Activity   Alcohol use: No   Drug use: No   Sexual activity: Not on file  Other Topics Concern   Not on file  Social History Narrative   Not on file   Social Determinants of Health   Financial Resource Strain: Not on file  Food Insecurity: Not on file  Transportation Needs: Not on file  Physical Activity: Not on file  Stress: Not on file  Social Connections: Not on file  Intimate Partner Violence: Not on file    FAMILY HISTORY: Family History  Problem Relation Age of Onset   Breast cancer Mother 37   Prostate cancer Father    Prostate cancer Maternal Grandfather 66 - 79   Breast cancer Maternal Aunt 50   Prostate cancer Maternal Uncle 7 - 89    ALLERGIES:  is allergic to meprobamate, dilaudid [hydromorphone hcl], other, penicillins, and tiagabine.  MEDICATIONS:  Current Outpatient Medications  Medication Sig Dispense Refill   clonazePAM (KLONOPIN) 0.5 MG tablet Take  0.5 mg by mouth 2 (two) times daily as needed for anxiety.     Docusate Sodium (DSS) 100 MG CAPS 1 capsule as needed Orally every other day     Polyethyl Glycol-Propyl Glycol (SYSTANE OP) Apply 1 drop to eye as needed. For dry eyes     ibuprofen (ADVIL,MOTRIN) 600 MG tablet Take 1 tablet (600 mg total) by mouth every 6 (six) hours as needed (mild pain). 30 tablet 1   oxyCODONE-acetaminophen (PERCOCET/ROXICET) 5-325 MG per tablet Take 1-2 tablets by mouth every 6 (six) hours as needed (moderate to severe pain (when tolerating fluids)). 30 tablet 0   No current facility-administered medications for this visit.    REVIEW OF SYSTEMS:   Constitutional: Denies fevers, chills or abnormal night sweats Breast:  Denies any palpable lumps or discharge All other systems were reviewed with the patient and are negative.  PHYSICAL EXAMINATION: ECOG PERFORMANCE STATUS: 0 - Asymptomatic  Vitals:   10/11/22 1254  BP: (!) 153/82  Pulse: 94  Resp: 17  Temp: 98.1 F (36.7 C)  SpO2: 98%   Filed Weights   10/11/22 1254  Weight: 151 lb (68.5 kg)    GENERAL:alert, no distress and comfortable    LABORATORY DATA:  I have reviewed the data as listed Lab Results  Component Value Date   WBC 7.7 10/11/2022   HGB 13.7 10/11/2022   HCT 39.2 10/11/2022   MCV 86.3 10/11/2022   PLT 231 10/11/2022   Lab Results  Component Value Date   NA 136 10/11/2022   K 3.9 10/11/2022   CL 103 10/11/2022   CO2 28 10/11/2022    RADIOGRAPHIC STUDIES: I have personally reviewed the radiological reports and agreed with the findings in the report.  ASSESSMENT AND PLAN:  Ductal carcinoma in situ (DCIS) of right breast 09/30/2022:Screening mammogram detected right breast calcifications UOQ 1.8 cm: Intermediate grade DCIS with necrosis ER 100% PR 95%  Pathology review: I discussed with the patient the difference between DCIS and invasive breast cancer. It is considered a precancerous lesion. DCIS is classified as a  0. It is generally detected through mammograms as calcifications. We discussed the significance of grades and its impact on prognosis. We also discussed the importance of ER and PR receptors and their implications to adjuvant treatment options. Prognosis of DCIS dependence on grade, comedo necrosis. It is anticipated that if not treated, 20-30% of DCIS can develop into invasive breast cancer.  Recommendation: 1. Breast conserving surgery 2. Followed by adjuvant radiation therapy 3. Followed by antiestrogen therapy with tamoxifen 5 years  Tamoxifen counseling: We discussed the risks and benefits of tamoxifen. These include but not limited to insomnia, hot flashes, mood changes, vaginal dryness, and weight gain. Although rare, serious side effects including endometrial cancer, risk of blood clots were also discussed. We strongly believe that the benefits far outweigh the risks. Patient understands these risks and consented to starting treatment. Planned treatment duration is 5 years.  Return  to clinic after surgery to discuss the final pathology report and come up with an adjuvant treatment plan.   All questions were answered. The patient knows to call the clinic with any problems, questions or concerns.    Tamsen Meek, MD 10/11/22

## 2022-10-11 NOTE — Progress Notes (Unsigned)
REFERRING PROVIDER: Serena Croissant, MD  PRIMARY PROVIDER:  Lindley Magnus, MD  PRIMARY REASON FOR VISIT:  Encounter Diagnoses  Name Primary?   Ductal carcinoma in situ (DCIS) of right breast Yes   Family history of breast cancer    Family history of prostate cancer    HISTORY OF PRESENT ILLNESS:   Ms. Koke, a 77 y.o. female, was seen for a [facility] cancer genetics consultation at the request of Dr. Pamelia Hoit due to a personal and family history of cancer.  Ms. Mcgann presents to clinic today to discuss the possibility of a hereditary predisposition to cancer, to discuss genetic testing, and to further clarify her future cancer risks, as well as potential cancer risks for family members.   In May 2024, at the age of 18, Ms. Hudley was diagnosed with ductal carcinoma in situ of the right breast (ER/PR positive).  CANCER HISTORY:  Oncology History  Ductal carcinoma in situ (DCIS) of right breast  09/30/2022 Initial Diagnosis   Screening mammogram detected right breast calcifications UOQ 1.8 cm: Intermediate grade DCIS with necrosis ER 100% PR 95%   10/11/2022 Cancer Staging   Staging form: Breast, AJCC 8th Edition - Clinical: Stage 0 (cTis (DCIS), cN0, cM0, G2, ER+, PR+, HER2: Not Assessed) - Signed by Serena Croissant, MD on 10/11/2022 Stage prefix: Initial diagnosis Histologic grading system: 3 grade system     RISK FACTORS:  Menarche was at age 41.  First live birth at age 1.  OCP use for approximately 1 year.  Ovaries intact: no.  Uterus intact: no.  Menopausal status: postmenopausal.  Colonoscopy: yes;  unsure of the date . Mammogram within the last year: yes. Any excessive radiation exposure in the past: no  Past Medical History:  Diagnosis Date   Asthma    no inhaler   PONV (postoperative nausea and vomiting)     Past Surgical History:  Procedure Laterality Date   BREAST BIOPSY Right 09/30/2022   MM RT BREAST BX W LOC DEV 1ST LESION IMAGE BX SPEC STEREO GUIDE  09/30/2022 [facility]   CATARACT EXTRACTION     bilateral   CHOLECYSTECTOMY     CYSTOCELE REPAIR  04/25/2012   Procedure: ANTERIOR REPAIR (CYSTOCELE);  Surgeon: Dorien Chihuahua. Richardson Dopp, MD;  Location: [facility];  Service: Gynecology;  Laterality: N/A;   DILATION AND CURETTAGE OF UTERUS     ROBOTIC ASSISTED TOTAL HYSTERECTOMY  04/25/2012   Procedure: ROBOTIC ASSISTED TOTAL HYSTERECTOMY;  Surgeon: Dorien Chihuahua. Richardson Dopp, MD;  Location[facility];  Service: Gynecology;  Laterality: N/A;   SALPINGOOPHORECTOMY  04/25/2012   Procedure: SALPINGO OOPHORECTOMY;  Surgeon: Dorien Chihuahua. Richardson Dopp, MD;  Location[facility];  Service: Gynecology;  Laterality: Bilateral;   TONSILLECTOMY     TUBAL LIGATION     WRIST SURGERY  2008    Social History   Socioeconomic History   Marital status: Single    Spouse name: Not on file   Number of children: Not on file   Years of education: Not on file   Highest education level: Not on file  Occupational History   Not on file  Tobacco Use   Smoking status: Never   Smokeless tobacco: Not on file  Substance and Sexual Activity   Alcohol use: No   Drug use: No   Sexual activity: Not on file  Other Topics Concern   Not on file  Social History Narrative   Not on file   Social Determinants of Health   Financial Resource  Strain: Not on file  Food Insecurity: Not on file  Transportation Needs: Not on file  Physical Activity: Not on file  Stress: Not on file  Social Connections: Not on file     FAMILY HISTORY:  We obtained a detailed, 4-generation family history.  Significant diagnoses are listed below: Family History  Problem Relation Age of Onset   Breast cancer Mother 74   Prostate cancer Father        metastatic   Prostate cancer Maternal Grandfather 40 - 79       metastatic   Breast cancer Maternal Aunt 50   Prostate cancer Maternal Uncle 63 - 89   Breast cancer Cousin 87 - 65       maternal first cousin   Thyroid cancer Daughter 98 - 19   Thyroid cancer Granddaughter 44       Ms. Myrie's daughter was diagnosed with thyroid cancer in her late 4s. This daughter's daughter (Ms. Delbert granddaughter) was diagnosed with thyroid cancer at age 62. Ms. Weymouth mother was diagnosed with breast cancer at age 54, she died at age 74 due to metastatic breast cancer. Ms. Zinn has four maternal aunts and one aunt was diagnosed with breast cancer at age 68, she died due to metastatic breast cancer. This aunt's daughter (Ms. Browell cousin) was diagnosed with breast cancer in her 7s, she died due to metastatic breast cancer. Ms. Palomeque has three maternal uncles and one uncle was diagnosed with prostate cancer in his 24s, he died due to metastatic prostate cancer. Her maternal grandfather was diagnosed with prostate cancer in his 39s, he died due to metastatic prostate cancer. Ms. Almonte father died due to metastatic prostate cancer at age 31. Ms. Lowhorn is unaware of previous family history of genetic testing for hereditary cancer risks. There is no reported Ashkenazi Jewish ancestry.   GENETIC COUNSELING ASSESSMENT: Ms. Darbonne is a 77 y.o. female with a personal and family history of cancer which is somewhat suggestive of a hereditary predisposition to cancer. We, therefore, discussed and recommended the following at today's visit.   DISCUSSION: We discussed that 5 - 10% of cancer is hereditary, with most cases of breast cancer associated with BRCA1/2.  There are other genes that can be associated with hereditary breast cancer syndromes.  We discussed that testing is beneficial for several reasons including knowing how to follow individuals after completing their treatment, identifying whether potential treatment options would be beneficial, and understanding if other family members could be at risk for cancer and allowing them to undergo genetic testing.   We reviewed the characteristics, features and inheritance patterns of hereditary cancer syndromes. We also discussed  genetic testing, including the appropriate family members to test, the process of testing, insurance coverage and turn-around-time for results. We discussed the implications of a negative, positive, carrier and/or variant of uncertain significant result. We recommended Ms. Hyacinth Meeker pursue genetic testing for a panel that includes genes associated with breast, thyroid, and prostate cancer.   Ms. Biber elected to have [facility] Common Cancer Panel+ PRKAR1A and RET (50 genes total). The Common Hereditary Cancers Panel offered by [facility] includes sequencing and/or deletion duplication testing of the following 48 genes: APC, ATM, AXIN2, BAP1, BARD1, BMPR1A, BRCA1, BRCA2, BRIP1, CDH1, CDK4, CDKN2A (p14ARF and p16INK4a only), CHEK2, CTNNA1, DICER1, EPCAM (Deletion/duplication testing only), FH, GREM1 (promoter region duplication testing only), HOXB13, KIT, MBD4, MEN1, MLH1, MSH2, MSH3, MSH6, MUTYH, NF1, NHTL1, PALB2, PDGFRA, PMS2, POLD1, POLE, PTEN, RAD51C, RAD51D, SDHA (sequencing analysis  only except exon 14), SDHB, SDHC, SDHD, SMAD4, SMARCA4. STK11, TP53, TSC1, TSC2, and VHL.  Based on Ms. Fedorko's personal and family history of cancer, she meets medical criteria for genetic testing. Despite that she meets criteria, she may still have an out of pocket cost. We discussed that if her out of pocket cost for testing is over $100, the laboratory will call and confirm whether she wants to proceed with testing.  If the out of pocket cost of testing is less than $100 she will be billed by the genetic testing laboratory.   PLAN: After considering the risks, benefits, and limitations, Ms. Keltz provided informed consent to pursue genetic testing and the blood sample was sent to [facility] for analysis of the Common Cancer Panel+ PRKAR1A and RET. Results should be available within approximately 2-3 weeks' time, at which point they will be disclosed by telephone to Ms. Hyacinth Meeker, as will any additional recommendations  warranted by these results. Ms. Hausch will receive a summary of her genetic counseling visit and a copy of her results once available. This information will also be available in Epic.   Ms. Lyng questions were answered to her satisfaction today. Our contact information was provided should additional questions or concerns arise. Thank you for the referral and allowing [place] to share in the care of your patient.   Lalla Brothers, MS, [facility] Genetic Counselor [email or web address] (P) [phone]  The patient was seen for a total of 20 minutes in face-to-face genetic counseling.  The patient brought her son. Drs. Pamelia Hoit and/or Mosetta Putt were available to discuss this case as needed.   _______________________________________________________________________ For Office Staff:  Number of people involved in session: 2 Was an Intern/ student involved with case: no

## 2022-10-11 NOTE — Progress Notes (Signed)
[facility] Psychosocial Distress Screening Spiritual Care  Met with Ashlee Sanford in Breast Multidisciplinary Clinic to introduce Support Center team/resources, reviewing distress screen per protocol.  The patient scored a 5 on the Psychosocial Distress Thermometer which indicates moderate distress. Also assessed for distress and other psychosocial needs.      10/11/2022    4:55 PM  ONCBCN DISTRESS SCREENING  Screening Type Initial Screening  Distress experienced in past week (1-10) 5  Practical problem type Insurance;Transportation  Emotional problem type Nervousness/Anxiety  Spiritual/Religous concerns type Facing my mortality  Information Concerns Type Lack of info about diagnosis;Lack of info about complementary therapy choices;Lack of info about treatment;Lack of info about maintaining fitness  Referral to support programs Yes    Chaplain and patient discussed common feelings and emotions when being diagnosed with cancer, and the importance of support during treatment.  Chaplain informed patient of the support team and support services at [facility].  Chaplain provided contact information and encouraged patient to call with any questions or concerns.  Ashlee Sanford reports good support. She feels reassured after learning that she should be able to transport herself to treatment. She knows to contact her insurance company with questions and to anticipate contact from a Database administrator.  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 She reports meaningful online participation in faith community that is important to her.  Follow up needed: NoVance Gather plans to phone chaplain as needed/desired.   [address] [place], [place], [facility] Pager [phone] Voicemail [phone]

## 2022-10-11 NOTE — Assessment & Plan Note (Signed)
09/30/2022:Screening mammogram detected right breast calcifications UOQ 1.8 cm: Intermediate grade DCIS with necrosis ER 100% PR 95%  Pathology review: I discussed with the patient the difference between DCIS and invasive breast cancer. It is considered a precancerous lesion. DCIS is classified as a 0. It is generally detected through mammograms as calcifications. We discussed the significance of grades and its impact on prognosis. We also discussed the importance of ER and PR receptors and their implications to adjuvant treatment options. Prognosis of DCIS dependence on grade, comedo necrosis. It is anticipated that if not treated, 20-30% of DCIS can develop into invasive breast cancer.  Recommendation: 1. Breast conserving surgery 2. Followed by adjuvant radiation therapy 3. Followed by antiestrogen therapy with tamoxifen 5 years  Tamoxifen counseling: We discussed the risks and benefits of tamoxifen. These include but not limited to insomnia, hot flashes, mood changes, vaginal dryness, and weight gain. Although rare, serious side effects including endometrial cancer, risk of blood clots were also discussed. We strongly believe that the benefits far outweigh the risks. Patient understands these risks and consented to starting treatment. Planned treatment duration is 5 years.  Return to clinic after surgery to discuss the final pathology report and come up with an adjuvant treatment plan.

## 2022-10-12 ENCOUNTER — Encounter: Payer: Self-pay | Admitting: Genetic Counselor

## 2022-10-12 DIAGNOSIS — Z8042 Family history of malignant neoplasm of prostate: Secondary | ICD-10-CM | POA: Insufficient documentation

## 2022-10-12 DIAGNOSIS — Z803 Family history of malignant neoplasm of breast: Secondary | ICD-10-CM | POA: Insufficient documentation

## 2022-10-17 ENCOUNTER — Other Ambulatory Visit: Payer: Self-pay | Admitting: *Deleted

## 2022-10-17 ENCOUNTER — Other Ambulatory Visit: Payer: Self-pay | Admitting: Surgery

## 2022-10-17 DIAGNOSIS — D0511 Intraductal carcinoma in situ of right breast: Secondary | ICD-10-CM

## 2022-10-19 ENCOUNTER — Telehealth: Payer: Self-pay | Admitting: *Deleted

## 2022-10-19 ENCOUNTER — Encounter: Payer: Self-pay | Admitting: *Deleted

## 2022-10-19 NOTE — Telephone Encounter (Signed)
Spoke with patient to follow up from [facility] and assess navigation needs. Patient denies any questions or concerns at this time. Encouraged her to call should anything arise. Patient verbalized understanding.  

## 2022-10-24 ENCOUNTER — Encounter: Payer: Self-pay | Admitting: Genetic Counselor

## 2022-10-24 ENCOUNTER — Ambulatory Visit: Payer: Self-pay | Admitting: Genetic Counselor

## 2022-10-24 ENCOUNTER — Telehealth: Payer: Self-pay | Admitting: Genetic Counselor

## 2022-10-24 ENCOUNTER — Telehealth: Payer: Self-pay | Admitting: Hematology and Oncology

## 2022-10-24 DIAGNOSIS — Z1379 Encounter for other screening for genetic and chromosomal anomalies: Secondary | ICD-10-CM

## 2022-10-24 NOTE — Telephone Encounter (Signed)
Scheduled appointment per scheduling message. Patient is aware of the made appointment. 

## 2022-10-24 NOTE — Telephone Encounter (Signed)
I contacted Ashlee Sanford to discuss her genetic testing results. No pathogenic variants were identified in the 50 genes analyzed. Of note, a variant of uncertain significance was identified in the BRCA2 gene. Detailed clinic note to follow.  The test report has been scanned into EPIC and is located under the Molecular Pathology section of the Results Review tab.  A portion of the result report is included below for reference.   Lalla Brothers, MS, [facility] Genetic Counselor [email or web address] (P) [phone]

## 2022-10-24 NOTE — Progress Notes (Signed)
     Your surgery and Pre-Admission testing visit will be at [facility] located at [address], [place], [place] [address].  Please let all your doctors (i.e., Primary Care Physician, Cardiologist, Endocrinologist, Pulmonologist) know you are having surgery. You may need clearance for surgery. If you are on blood thinners, notify your surgeon and ask the doctor who prescribed them how long to hold them before surgery.  If you have had a heart test, such as an EKG, stress test, heart ultrasound, etc., or lab work performed outside of [facility], please bring copies of these tests to your Pre-Admission testing, if possible.  These departments may contact you before the day of surgery:  Pre-Service Center - insurance/ billing: [phone] Pharmacy- to review your medications: [phone] Pre-Admission Testing- to set an appointment for your visit: [phone]  (Often, these numbers show up as "SPAM" on your phone)  The Pre-Admission Testing (PAT) visit focuses on Anesthesia for your upcoming surgery.  You do NOT need to fast; take your medications as usual. Please arrive 30 minutes early to allow for parking and admitting.  The visit may last up to an hour. Bring a photo ID and medical insurance card. Reschedule if you are sick. ([phone]) and please, NO children under age 16 at the visit.  During the PAT visit:  We will review your medical and surgical history.   You will receive pre-operative instructions, including the time of arrival at the hospital and surgical start time.  We will review what medication(s) you can take on the day of surgery.  After speaking with the nurse, you will have blood drawn and, if needed, a chest x-ray and EKG.  Most lab results from your doctor are good for 30 days, Hemoglobin A1C is good for 60 days. If you cannot talk to the Pharmacy, bring your medications or a list of them to the PST visit.   Infection control for the [facility] requires: All fingernail and toenail products should be removed before the day of surgery.  (SNS, Acrylic, Gel, Polish, Stickers, Press on, and Poly gel nails.)   Parking information:  Address:  Hospital - [address], [place], [place] [address]  Please look for signs for entrance A off of [address]. Free valet parking is available Monday-Friday 05:30am-06:00pm     

## 2022-10-24 NOTE — Progress Notes (Signed)
HPI:   Ashlee Sanford was previously seen in the [facility] due to a personal and family history of cancer and concerns regarding a hereditary predisposition to cancer. Please refer to our prior cancer genetics clinic note for more information regarding our discussion, assessment and recommendations, at the time. Ashlee Sanford recent genetic test results were disclosed to her, as were recommendations warranted by these results. These results and recommendations are discussed in more detail below.  CANCER HISTORY:  Oncology History  Ductal carcinoma in situ (DCIS) of right breast  09/30/2022 Initial Diagnosis   Screening mammogram detected right breast calcifications UOQ 1.8 cm: Intermediate grade DCIS with necrosis ER 100% PR 95%   10/11/2022 Cancer Staging   Staging form: Breast, AJCC 8th Edition - Clinical: Stage 0 (cTis (DCIS), cN0, cM0, G2, ER+, PR+, HER2: Not Assessed) - Signed by Serena Croissant, MD on 10/11/2022 Stage prefix: Initial diagnosis Histologic grading system: 3 grade system    Genetic Testing   [facility] Custom Panel+RNA was Negative. Of note, a variant of uncertain significance was detected in the BRCA2 gene (c.9159G>T). Report date is 10/19/2022.  The Custom Hereditary Cancers Panel offered by [facility] includes sequencing and/or deletion duplication testing of the following 50 genes: APC, ATM, AXIN2, BAP1, BARD1, BMPR1A, BRCA1, BRCA2, BRIP1, CDH1, CDK4, CDKN2A (p14ARF and p16INK4a only), CHEK2, CTNNA1, DICER1, EPCAM (Deletion/duplication testing only), FH, GREM1 (promoter region duplication testing only), HOXB13, KIT, MBD4, MEN1, MLH1, MSH2, MSH3, MSH6, MUTYH, NF1, NHTL1, PALB2, PDGFRA, PMS2, POLD1, POLE, PRKAR1A, PTEN, RAD51C, RAD51D, RET, SDHA (sequencing analysis only except exon 14), SDHB, SDHC, SDHD, SMAD4, SMARCA4. STK11, TP53, TSC1, TSC2, and VHL.     FAMILY HISTORY:  We obtained a detailed, 4-generation family history.  Significant diagnoses are listed  below:      Family History  Problem Relation Age of Onset   Breast cancer Mother 68   Prostate cancer Father          metastatic   Prostate cancer Maternal Grandfather 34 - 79        metastatic   Breast cancer Maternal Aunt 50   Prostate cancer Maternal Uncle 65 - 89   Breast cancer Cousin 24 - 37        maternal first cousin   Thyroid cancer Daughter 80 - 47   Thyroid cancer Granddaughter 35         Ashlee Sanford's daughter was diagnosed with thyroid cancer in her late 22s. This daughter's daughter (Ashlee Sanford granddaughter) was diagnosed with thyroid cancer at age 70. Ashlee Sanford mother was diagnosed with breast cancer at age 4, she died at age 32 due to metastatic breast cancer. Ashlee Sanford has four maternal aunts and one aunt was diagnosed with breast cancer at age 57, she died due to metastatic breast cancer. This aunt's daughter (Ashlee Sanford cousin) was diagnosed with breast cancer in her 43s, she died due to metastatic breast cancer. Ashlee Sanford has three maternal uncles and one uncle was diagnosed with prostate cancer in his 48s, he died due to metastatic prostate cancer. Her maternal grandfather was diagnosed with prostate cancer in his 87s, he died due to metastatic prostate cancer. Ashlee Sanford father died due to metastatic prostate cancer at age 83. Ashlee Sanford is unaware of previous family history of genetic testing for hereditary cancer risks. There is no reported Ashkenazi Jewish ancestry.   GENETIC TEST RESULTS:  The [facility] Custom Panel found no pathogenic mutations.   The Custom Hereditary Cancers Panel  offered by [facility] includes sequencing and/or deletion duplication testing of the following 50 genes: APC, ATM, AXIN2, BAP1, BARD1, BMPR1A, BRCA1, BRCA2, BRIP1, CDH1, CDK4, CDKN2A (p14ARF and p16INK4a only), CHEK2, CTNNA1, DICER1, EPCAM (Deletion/duplication testing only), FH, GREM1 (promoter region duplication testing only), HOXB13, KIT, MBD4, MEN1, MLH1, MSH2, MSH3, MSH6,  MUTYH, NF1, NHTL1, PALB2, PDGFRA, PMS2, POLD1, POLE, PRKAR1A, PTEN, RAD51C, RAD51D, RET, SDHA (sequencing analysis only except exon 14), SDHB, SDHC, SDHD, SMAD4, SMARCA4. STK11, TP53, TSC1, TSC2, and VHL.   The test report has been scanned into EPIC and is located under the Molecular Pathology section of the Results Review tab.  A portion of the result report is included below for reference. Genetic testing reported out on 10/19/2022.      Genetic testing identified a variant of uncertain significance (VUS) in the BRCA2 gene called c.9159G>T.  At this time, it is unknown if this variant is associated with an increased risk for cancer or if it is benign, but most uncertain variants are reclassified to benign. It should not be used to make medical management decisions. With time, we suspect the laboratory will determine the significance of this variant, if any. If the laboratory reclassifies this variant, we will attempt to contact Ashlee Sanford to discuss it further.   Even though a pathogenic variant was not identified, possible explanations for the cancer in the family may include: There may be no hereditary risk for cancer in the family. The cancers in Ashlee Sanford and/or her family may be due to other genetic or environmental factors. There may be a gene mutation in one of these genes that current testing methods cannot detect, but that chance is small. There could be another gene that has not yet been discovered, or that we have not yet tested, that is responsible for the cancer diagnoses in the family.  It is also possible there is a hereditary cause for the cancer in the family that Ashlee Sanford did not inherit. The variant of uncertain significance detected in the BRCA2 gene may be reclassified as a pathogenic variant in the future. At this time, we do not know if this variant increases the risk for cancer.  Therefore, it is important to remain in touch with cancer genetics in the future so that we  can continue to offer Ashlee Sanford the most up to date genetic testing.    ADDITIONAL GENETIC TESTING:  We discussed with Ashlee Sanford that her genetic testing was fairly extensive.  If there are genes identified to increase cancer risk that can be analyzed in the future, we would be happy to discuss and coordinate this testing at that time.    CANCER SCREENING RECOMMENDATIONS:  Ashlee Sanford test result is considered negative (normal).  This means that we have not identified a hereditary cause for her personal and family history of cancer at this time.   An individual's cancer risk and medical management are not determined by genetic test results alone. Overall cancer risk assessment incorporates additional factors, including personal medical history, family history, and any available genetic information that may result in a personalized plan for cancer prevention and surveillance. Therefore, it is recommended she continue to follow the cancer management and screening guidelines provided by her oncology and primary healthcare provider.  RECOMMENDATIONS FOR FAMILY MEMBERS:   Since she did not inherit a mutation in a cancer predisposition gene included on this panel, her children could not have inherited a mutation from her in one of these genes. Individuals in  this family might be at some increased risk of developing cancer, over the general population risk, due to the family history of cancer. We recommend women in this family have a yearly mammogram beginning at age 76, or 19 years younger than the earliest onset of cancer, an annual clinical breast exam, and perform monthly breast self-exams. We do not recommend familial testing for the BRCA2 variant of uncertain significance (VUS).  FOLLOW-UP:  Cancer genetics is a rapidly advancing field and it is possible that new genetic tests will be appropriate for her and/or her family members in the future. We encouraged her to remain in contact with cancer  genetics on an annual basis so we can update her personal and family histories and let her know of advances in cancer genetics that may benefit this family.   Our contact number was provided. Ms. Bligen questions were answered to her satisfaction, and she knows she is welcome to call us at anytime with additional questions or concerns.   Lalla Brothers, MS, [facility] Genetic Counselor [email or web address] (P) [phone]

## 2022-10-26 NOTE — Pre-Procedure Instructions (Signed)
Surgical Instructions    Your procedure is scheduled on Thursday, June 13th.  Report to [facility] Main Entrance "A" at 07:30 A.M., then check in with the Admitting office.  Call this number if you have problems the morning of surgery:  [phone]  If you have any questions prior to your surgery date call [phone]: Open Monday-Friday 8am-4pm If you experience any cold or flu symptoms such as cough, fever, chills, shortness of breath, etc. between now and your scheduled surgery, please notify us at the above number.     Remember:  Do not eat after midnight the night before your surgery  You may drink clear liquids until 06:30 AM the morning of your surgery.   Clear liquids allowed are: Water, Non-Citrus Juices (without pulp), Carbonated Beverages, Clear Tea, Black Coffee Only (NO MILK, CREAM OR POWDERED CREAMER of any kind), and Gatorade.    Take these medicines the morning of surgery with A SIP OF WATER   If needed: clonazePAM (KLONOPIN)  Dextran 70-Hypromellose (ARTIFICIAL TEARS PF OP)    As of today, STOP taking any Aspirin (unless otherwise instructed by your surgeon) Aleve, Naproxen, Ibuprofen, Motrin, Advil, Goody's, BC's, all herbal medications, fish oil, and all vitamins.                     Do NOT Smoke (Tobacco/Vaping) for 24 hours prior to your procedure.  If you use a CPAP at night, you may bring your mask/headgear for your overnight stay.   Contacts, glasses, piercing's, hearing aid's, dentures or partials may not be worn into surgery, please bring cases for these belongings.    For patients admitted to the hospital, discharge time will be determined by your treatment team.   Patients discharged the day of surgery will not be allowed to drive home, and someone needs to stay with them for 24 hours.  SURGICAL WAITING ROOM VISITATION Patients having surgery or a procedure may have no more than 2 support people in the waiting area - these visitors may rotate.    Children under the age of 73 must have an adult with them who is not the patient. If the patient needs to stay at the hospital during part of their recovery, the visitor guidelines for inpatient rooms apply. Pre-op nurse will coordinate an appropriate time for 1 support person to accompany patient in pre-op.  This support person may not rotate.   Please refer to the [facility] website for the visitor guidelines for Inpatients (after your surgery is over and you are in a regular room).    Special instructions:   [facility]- Preparing For Surgery  Before surgery, you can play an important role. Because skin is not sterile, your skin needs to be as free of germs as possible. You can reduce the number of germs on your skin by washing with CHG (chlorahexidine gluconate) Soap before surgery.  CHG is an antiseptic cleaner which kills germs and bonds with the skin to continue killing germs even after washing.    Oral Hygiene is also important to reduce your risk of infection.  Remember - BRUSH YOUR TEETH THE MORNING OF SURGERY WITH YOUR REGULAR TOOTHPASTE  Please do not use if you have an allergy to CHG or antibacterial soaps. If your skin becomes reddened/irritated stop using the CHG.  Do not shave (including legs and underarms) for at least 48 hours prior to first CHG shower. It is OK to shave your face.  Please follow these instructions carefully.  Shower the NIGHT BEFORE SURGERY and the MORNING OF SURGERY  If you chose to wash your hair, wash your hair first as usual with your normal shampoo.  After you shampoo, rinse your hair and body thoroughly to remove the shampoo.  Use CHG Soap as you would any other liquid soap. You can apply CHG directly to the skin and wash gently with a scrungie or a clean washcloth.   Apply the CHG Soap to your body ONLY FROM THE NECK DOWN.  Do not use on open wounds or open sores. Avoid contact with your eyes, ears, mouth and genitals (private parts). Wash Face  and genitals (private parts)  with your normal soap.   Wash thoroughly, paying special attention to the area where your surgery will be performed.  Thoroughly rinse your body with warm water from the neck down.  DO NOT shower/wash with your normal soap after using and rinsing off the CHG Soap.  Pat yourself dry with a CLEAN TOWEL.  Wear CLEAN PAJAMAS to bed the night before surgery  Place CLEAN SHEETS on your bed the night before your surgery  DO NOT SLEEP WITH PETS.   Day of Surgery: Take a shower with CHG soap. Do not wear jewelry or makeup Do not wear lotions, powders, perfumes, or deodorant. Do not shave 48 hours prior to surgery.   Do not bring valuables to the hospital. [facility] is not responsible for any belongings or valuables. Do not wear nail polish, gel polish, artificial nails, or any other type of covering on natural nails (fingers and toes) If you have artificial nails or gel coating that need to be removed by a nail salon, please have this removed prior to surgery. Artificial nails or gel coating may interfere with anesthesia's ability to adequately monitor your vital signs. Wear Clean/Comfortable clothing the morning of surgery Remember to brush your teeth WITH YOUR REGULAR TOOTHPASTE.   Please read over the following fact sheets that you were given.    If you received a COVID test during your pre-op visit  it is requested that you wear a mask when out in public, stay away from anyone that may not be feeling well and notify your surgeon if you develop symptoms. If you have been in contact with anyone that has tested positive in the last 10 days please notify you surgeon.

## 2022-10-27 ENCOUNTER — Encounter (HOSPITAL_COMMUNITY): Payer: Self-pay

## 2022-10-27 ENCOUNTER — Other Ambulatory Visit: Payer: Self-pay

## 2022-10-27 ENCOUNTER — Encounter (HOSPITAL_COMMUNITY)
Admission: RE | Admit: 2022-10-27 | Discharge: 2022-10-27 | Disposition: A | Discharge status: Home / Self Care | Payer: PPO | Source: Ambulatory Visit | Attending: Surgery | Admitting: Surgery

## 2022-10-27 DIAGNOSIS — Z01818 Encounter for other preprocedural examination: Secondary | ICD-10-CM | POA: Diagnosis present

## 2022-10-27 HISTORY — DX: Prediabetes: R73.03

## 2022-10-27 HISTORY — DX: Anxiety disorder, unspecified: F41.9

## 2022-10-27 HISTORY — DX: Basal cell carcinoma of skin, unspecified: C44.91

## 2022-10-27 HISTORY — DX: Squamous cell carcinoma of skin, unspecified: C44.92

## 2022-10-27 HISTORY — DX: Personal history of pneumonia (recurrent): Z87.01

## 2022-10-27 HISTORY — DX: Epithelial (juvenile) corneal dystrophy, bilateral: H18.523

## 2022-10-27 NOTE — Progress Notes (Signed)
PCP - Pahwani, Rinka Cardiologist - denies  PPM/ICD - denies   Chest x-ray - N/A EKG - N/A Stress Test - pt reports normal Stress test in 1990s when she was having gallbladder issues.  ECHO - denies Cardiac Cath - denies  Sleep Study - denies   Fasting Blood Sugar - pt is pre-DM, does not check blood sugar at home.    Last dose of GLP1 agonist-  N/A   Blood Thinner Instructions: N/A Aspirin Instructions:N/A  ERAS Protcol - ERAS per order   COVID TEST-  N/A   Anesthesia review: yes- seed placement on 10/31/22.   Patient denies shortness of breath, fever, cough and chest pain at PAT appointment   All instructions explained to the patient, with a verbal understanding of the material. Patient agrees to go over the instructions while at home for a better understanding.  The opportunity to ask questions was provided.

## 2022-10-31 ENCOUNTER — Telehealth: Payer: Self-pay | Admitting: *Deleted

## 2022-10-31 ENCOUNTER — Ambulatory Visit
Admission: RE | Admit: 2022-10-31 | Discharge: 2022-10-31 | Disposition: A | Discharge status: Home / Self Care | Payer: PPO | Source: Ambulatory Visit | Attending: Surgery | Admitting: Surgery

## 2022-10-31 HISTORY — PX: BREAST BIOPSY: SHX20

## 2022-10-31 NOTE — Telephone Encounter (Signed)
Left message for a return phone call in regards to her questions regarding costs of xrt treatments.

## 2022-11-02 ENCOUNTER — Ambulatory Visit (HOSPITAL_COMMUNITY): Payer: PPO | Admitting: Physician Assistant

## 2022-11-02 ENCOUNTER — Other Ambulatory Visit: Payer: Self-pay

## 2022-11-02 ENCOUNTER — Encounter (HOSPITAL_COMMUNITY)
Admission: RE | Disposition: A | Discharge status: Home / Self Care | Payer: Self-pay | Source: Home / Self Care | Attending: Surgery

## 2022-11-02 ENCOUNTER — Ambulatory Visit (HOSPITAL_BASED_OUTPATIENT_CLINIC_OR_DEPARTMENT_OTHER): Payer: PPO | Admitting: Anesthesiology

## 2022-11-02 ENCOUNTER — Ambulatory Visit (HOSPITAL_COMMUNITY)
Admission: RE | Admit: 2022-11-02 | Discharge: 2022-11-02 | Disposition: A | Discharge status: Home / Self Care | Payer: PPO | Attending: Surgery | Admitting: Surgery

## 2022-11-02 ENCOUNTER — Ambulatory Visit
Admission: RE | Admit: 2022-11-02 | Discharge: 2022-11-02 | Disposition: A | Discharge status: Home / Self Care | Payer: PPO | Source: Ambulatory Visit | Attending: Surgery | Admitting: Surgery

## 2022-11-02 ENCOUNTER — Encounter (HOSPITAL_COMMUNITY): Payer: Self-pay | Admitting: Surgery

## 2022-11-02 DIAGNOSIS — F419 Anxiety disorder, unspecified: Secondary | ICD-10-CM | POA: Diagnosis not present

## 2022-11-02 DIAGNOSIS — D0511 Intraductal carcinoma in situ of right breast: Secondary | ICD-10-CM

## 2022-11-02 DIAGNOSIS — I10 Essential (primary) hypertension: Secondary | ICD-10-CM

## 2022-11-02 DIAGNOSIS — Z170 Estrogen receptor positive status [ER+]: Secondary | ICD-10-CM | POA: Insufficient documentation

## 2022-11-02 DIAGNOSIS — Z803 Family history of malignant neoplasm of breast: Secondary | ICD-10-CM | POA: Diagnosis not present

## 2022-11-02 DIAGNOSIS — Z8042 Family history of malignant neoplasm of prostate: Secondary | ICD-10-CM | POA: Diagnosis not present

## 2022-11-02 HISTORY — PX: BREAST LUMPECTOMY WITH RADIOACTIVE SEED LOCALIZATION: SHX6424

## 2022-11-02 SURGERY — BREAST LUMPECTOMY WITH RADIOACTIVE SEED LOCALIZATION
Anesthesia: General | Site: Breast | Laterality: Right

## 2022-11-02 MED ORDER — CHLORHEXIDINE GLUCONATE 0.12 % MT SOLN
15.0000 mL | Freq: Once | OROMUCOSAL | Status: AC
  Administered 2022-11-02: 15 mL via OROMUCOSAL

## 2022-11-02 MED ORDER — PROPOFOL 500 MG/50ML IV EMUL
INTRAVENOUS | Status: DC | PRN
  Administered 2022-11-02: 100 ug/kg/min via INTRAVENOUS

## 2022-11-02 MED ORDER — PHENYLEPHRINE 80 MCG/ML (10ML) SYRINGE FOR IV PUSH (FOR BLOOD PRESSURE SUPPORT)
PREFILLED_SYRINGE | INTRAVENOUS | Status: AC
  Filled 2022-11-02: qty 10

## 2022-11-02 MED ORDER — FENTANYL CITRATE (PF) 250 MCG/5ML IJ SOLN
INTRAMUSCULAR | Status: DC | PRN
  Administered 2022-11-02 (×3): 50 ug via INTRAVENOUS

## 2022-11-02 MED ORDER — OXYCODONE HCL 5 MG PO TABS
5.0000 mg | ORAL_TABLET | Freq: Four times a day (QID) | ORAL | 0 refills | Status: DC | PRN
Start: 2022-11-02 — End: 2023-02-07

## 2022-11-02 MED ORDER — OXYCODONE HCL 5 MG PO TABS: 5.0000 mg | ORAL_TABLET | Freq: Once | ORAL | Status: DC | PRN

## 2022-11-02 MED ORDER — ONDANSETRON HCL 4 MG/2ML IJ SOLN: 4.0000 mg | Freq: Once | INTRAMUSCULAR | Status: DC | PRN

## 2022-11-02 MED ORDER — ONDANSETRON HCL 4 MG/2ML IJ SOLN
INTRAMUSCULAR | Status: AC
  Filled 2022-11-02: qty 2

## 2022-11-02 MED ORDER — EPHEDRINE 5 MG/ML INJ
INTRAVENOUS | Status: AC
  Filled 2022-11-02: qty 5

## 2022-11-02 MED ORDER — 0.9 % SODIUM CHLORIDE (POUR BTL) OPTIME
TOPICAL | Status: DC | PRN
  Administered 2022-11-02: 1000 mL

## 2022-11-02 MED ORDER — CHLORHEXIDINE GLUCONATE CLOTH 2 % EX PADS: 6.0000 | MEDICATED_PAD | Freq: Once | CUTANEOUS | Status: DC

## 2022-11-02 MED ORDER — CLINDAMYCIN PHOSPHATE 900 MG/50ML IV SOLN
900.0000 mg | INTRAVENOUS | Status: AC
  Administered 2022-11-02: 900 mg via INTRAVENOUS
  Filled 2022-11-02: qty 50

## 2022-11-02 MED ORDER — PROPOFOL 1000 MG/100ML IV EMUL
INTRAVENOUS | Status: AC
  Filled 2022-11-02: qty 100

## 2022-11-02 MED ORDER — FENTANYL CITRATE (PF) 250 MCG/5ML IJ SOLN
INTRAMUSCULAR | Status: AC
  Filled 2022-11-02: qty 5

## 2022-11-02 MED ORDER — MIDAZOLAM HCL 2 MG/2ML IJ SOLN
INTRAMUSCULAR | Status: AC
  Filled 2022-11-02: qty 2

## 2022-11-02 MED ORDER — PROPOFOL 10 MG/ML IV BOLUS
INTRAVENOUS | Status: AC
  Filled 2022-11-02: qty 20

## 2022-11-02 MED ORDER — PHENYLEPHRINE 80 MCG/ML (10ML) SYRINGE FOR IV PUSH (FOR BLOOD PRESSURE SUPPORT)
PREFILLED_SYRINGE | INTRAVENOUS | Status: DC | PRN
  Administered 2022-11-02: 80 ug via INTRAVENOUS

## 2022-11-02 MED ORDER — LIDOCAINE 2% (20 MG/ML) 5 ML SYRINGE
INTRAMUSCULAR | Status: AC
  Filled 2022-11-02: qty 5

## 2022-11-02 MED ORDER — BUPIVACAINE-EPINEPHRINE (PF) 0.25% -1:200000 IJ SOLN
INTRAMUSCULAR | Status: AC
  Filled 2022-11-02: qty 30

## 2022-11-02 MED ORDER — IBUPROFEN 800 MG PO TABS
800.0000 mg | ORAL_TABLET | Freq: Three times a day (TID) | ORAL | 0 refills | Status: DC | PRN
Start: 2022-11-02 — End: 2023-08-07

## 2022-11-02 MED ORDER — PROPOFOL 10 MG/ML IV BOLUS
INTRAVENOUS | Status: DC | PRN
  Administered 2022-11-02: 150 mg via INTRAVENOUS

## 2022-11-02 MED ORDER — SCOPOLAMINE 1 MG/3DAYS TD PT72
MEDICATED_PATCH | TRANSDERMAL | Status: AC
  Filled 2022-11-02: qty 1

## 2022-11-02 MED ORDER — BUPIVACAINE-EPINEPHRINE 0.25% -1:200000 IJ SOLN
INTRAMUSCULAR | Status: DC | PRN
  Administered 2022-11-02: 20 mL

## 2022-11-02 MED ORDER — ACETAMINOPHEN 500 MG PO TABS
1000.0000 mg | ORAL_TABLET | Freq: Once | ORAL | Status: AC
  Administered 2022-11-02: 1000 mg via ORAL

## 2022-11-02 MED ORDER — ACETAMINOPHEN 500 MG PO TABS
ORAL_TABLET | ORAL | Status: AC
  Filled 2022-11-02: qty 2

## 2022-11-02 MED ORDER — MIDAZOLAM HCL 2 MG/2ML IJ SOLN
INTRAMUSCULAR | Status: DC | PRN
  Administered 2022-11-02: 1 mg via INTRAVENOUS

## 2022-11-02 MED ORDER — DEXAMETHASONE SODIUM PHOSPHATE 10 MG/ML IJ SOLN
INTRAMUSCULAR | Status: DC | PRN
  Administered 2022-11-02: 10 mg via INTRAVENOUS

## 2022-11-02 MED ORDER — AMISULPRIDE (ANTIEMETIC) 5 MG/2ML IV SOLN: 10.0000 mg | Freq: Once | INTRAVENOUS | Status: DC | PRN

## 2022-11-02 MED ORDER — ORAL CARE MOUTH RINSE: 15.0000 mL | Freq: Once | OROMUCOSAL | Status: AC

## 2022-11-02 MED ORDER — FENTANYL CITRATE (PF) 100 MCG/2ML IJ SOLN: 25.0000 ug | INTRAMUSCULAR | Status: DC | PRN

## 2022-11-02 MED ORDER — LACTATED RINGERS IV SOLN: INTRAVENOUS | Status: DC

## 2022-11-02 MED ORDER — LIDOCAINE 2% (20 MG/ML) 5 ML SYRINGE
INTRAMUSCULAR | Status: DC | PRN
  Administered 2022-11-02: 60 mg via INTRAVENOUS

## 2022-11-02 MED ORDER — OXYCODONE HCL 5 MG/5ML PO SOLN: 5.0000 mg | Freq: Once | ORAL | Status: DC | PRN

## 2022-11-02 MED ORDER — CHLORHEXIDINE GLUCONATE 0.12 % MT SOLN
OROMUCOSAL | Status: AC
  Filled 2022-11-02: qty 15

## 2022-11-02 MED ORDER — DEXAMETHASONE SODIUM PHOSPHATE 10 MG/ML IJ SOLN
INTRAMUSCULAR | Status: AC
  Filled 2022-11-02: qty 1

## 2022-11-02 MED ORDER — SCOPOLAMINE 1 MG/3DAYS TD PT72
1.0000 | MEDICATED_PATCH | TRANSDERMAL | Status: DC
  Administered 2022-11-02: 1.5 mg via TRANSDERMAL

## 2022-11-02 SURGICAL SUPPLY — 40 items
ADH SKN CLS APL DERMABOND .7 (GAUZE/BANDAGES/DRESSINGS) ×1
APL PRP STRL LF DISP 70% ISPRP (MISCELLANEOUS) ×1
APPLIER CLIP 9.375 MED OPEN (MISCELLANEOUS) ×1
APR CLP MED 9.3 20 MLT OPN (MISCELLANEOUS) ×1
BAG COUNTER SPONGE SURGICOUNT (BAG) ×2 IMPLANT
BAG SPNG CNTER NS LX DISP (BAG) ×1
BINDER BREAST LRG (GAUZE/BANDAGES/DRESSINGS) IMPLANT
BINDER BREAST XLRG (GAUZE/BANDAGES/DRESSINGS) IMPLANT
CANISTER SUCT 3000ML PPV (MISCELLANEOUS) IMPLANT
CHLORAPREP W/TINT 26 (MISCELLANEOUS) ×2 IMPLANT
CLIP APPLIE 9.375 MED OPEN (MISCELLANEOUS) IMPLANT
COVER PROBE W GEL 5X96 (DRAPES) ×2 IMPLANT
COVER SURGICAL LIGHT HANDLE (MISCELLANEOUS) ×2 IMPLANT
DERMABOND ADVANCED .7 DNX12 (GAUZE/BANDAGES/DRESSINGS) ×2 IMPLANT
DEVICE DUBIN SPECIMEN MAMMOGRA (MISCELLANEOUS) ×2 IMPLANT
DRAPE CHEST BREAST 15X10 FENES (DRAPES) ×2 IMPLANT
ELECT CAUTERY BLADE 6.4 (BLADE) ×2 IMPLANT
ELECT REM PT RETURN 9FT ADLT (ELECTROSURGICAL) ×1
ELECTRODE REM PT RTRN 9FT ADLT (ELECTROSURGICAL) ×2 IMPLANT
GAUZE PAD ABD 8X10 STRL (GAUZE/BANDAGES/DRESSINGS) ×2 IMPLANT
GLOVE BIO SURGEON STRL SZ8 (GLOVE) ×2 IMPLANT
GLOVE BIOGEL PI IND STRL 8 (GLOVE) ×2 IMPLANT
GOWN STRL REUS W/ TWL LRG LVL3 (GOWN DISPOSABLE) ×2 IMPLANT
GOWN STRL REUS W/ TWL XL LVL3 (GOWN DISPOSABLE) ×2 IMPLANT
GOWN STRL REUS W/TWL LRG LVL3 (GOWN DISPOSABLE) ×1
GOWN STRL REUS W/TWL XL LVL3 (GOWN DISPOSABLE) ×1
KIT BASIN OR (CUSTOM PROCEDURE TRAY) ×2 IMPLANT
KIT MARKER MARGIN INK (KITS) ×2 IMPLANT
LIGHT WAVEGUIDE WIDE FLAT (MISCELLANEOUS) IMPLANT
NDL HYPO 25GX1X1/2 BEV (NEEDLE) ×2 IMPLANT
NEEDLE HYPO 25GX1X1/2 BEV (NEEDLE) ×1 IMPLANT
NS IRRIG 1000ML POUR BTL (IV SOLUTION) IMPLANT
PACK GENERAL/GYN (CUSTOM PROCEDURE TRAY) ×2 IMPLANT
SUT MNCRL AB 4-0 PS2 18 (SUTURE) ×2 IMPLANT
SUT SILK 2 0 SH (SUTURE) IMPLANT
SUT VIC AB 2-0 SH 27 (SUTURE)
SUT VIC AB 2-0 SH 27XBRD (SUTURE) IMPLANT
SUT VIC AB 3-0 SH 8-18 (SUTURE) ×2 IMPLANT
SYR CONTROL 10ML LL (SYRINGE) ×2 IMPLANT
TOWEL GREEN STERILE FF (TOWEL DISPOSABLE) IMPLANT

## 2022-11-02 NOTE — Op Note (Addendum)
Preoperative diagnosis: Right breast DCIS upper outer quadrant  Postoperative diagnosis: Same  Procedure: Right breast seed localized lumpectomy  Surgeon: Harriette Bouillon, MD  Anesthesia: LMA with 0.25% Marcaine with epinephrine  EBL: mL  Specimen: Right breast tissue with seed and clip verified by Faxitron  Drains: None  Indications for procedure: The patient is a 77 year old female with right breast DCIS.  She was seen in the multidisciplinary clinic and opted for breast conserving surgery after reviewing all of her options.The procedure has been discussed with the patient. Alternatives to surgery have been discussed with the patient.  Risks of surgery include bleeding,  Infection,  Seroma formation, death,  and the need for further surgery.   The patient understands and wishes to proceed.     Description of procedure: The patient was met in the holding area and questions were answered.  Of note she had a seed placed as an outpatient in the right breast and films were available for review.  Right side was marked as correct site and all questions were answered.  She was then taken back to the operating room placed supine upon the operating table.  After induction of general anesthesia, right breast was prepped and draped in a sterile fashion timeout performed.  Neoprobe used to identify the seed right breast upper outer quadrant.  Local anesthetic infiltrated and a curvilinear incision was made over the signal in the right upper outer quadrant.  Dissection was carried down all tissue and the seed and clip were excised with grossly negative margins.  The imaging revealed seed and clip to be present in the specimen and the radiologist verified it.  Of note the tissue was oriented with ink and sent to pathology.  The cavities made hemostatic with cautery.  Irrigation used.  Local anesthetic infiltrated throughout.  Deep tissue planes closed with 3-0 Vicryl.  4 Monocryl used to close the skin in a  subcuticular fashion.  All counts were found to be correct.  Breast binder placed.  The patient was awoke extubated taken to recovery in satisfactory condition.

## 2022-11-02 NOTE — Anesthesia Procedure Notes (Signed)
Procedure Name: LMA Insertion Date/Time: 11/02/2022 10:18 AM  Performed by: Evlyn Courier, CRNAPre-anesthesia Checklist: Patient identified, Emergency Drugs available, Suction available and Patient being monitored Patient Re-evaluated:Patient Re-evaluated prior to induction Oxygen Delivery Method: Circle System Utilized Preoxygenation: Pre-oxygenation with 100% oxygen Induction Type: IV induction Ventilation: Mask ventilation without difficulty LMA: LMA inserted LMA Size: 4.0 Number of attempts: 1 Airway Equipment and Method: Bite block Placement Confirmation: positive ETCO2 and breath sounds checked- equal and bilateral Tube secured with: Tape Dental Injury: Teeth and Oropharynx as per pre-operative assessment

## 2022-11-02 NOTE — H&P (Signed)
Chief Complaint: Breast Cancer  History of Present Illness: Ashlee Sanford is a 77 y.o. female who is seen today as an office consultation for evaluation of Breast Cancer  Patient presents to the [facility] today for evaluation of abnormal right mammogram. She had a 1.8 cm cluster of suspicious microcalcifications upper outer quadrant core biopsy to be consistent with DCIS. Intermediate grade, ER positive PR positive. She has no other complaints. She has multiple first-degree relatives with breast cancer and prostate cancer.  Review of Systems: A complete review of systems was obtained from the patient. I have reviewed this information and discussed as appropriate with the patient. See HPI as well for other ROS.    Medical History: Past Medical History:  Diagnosis Date  Anxiety  Asthma, unspecified asthma severity, unspecified whether complicated, unspecified whether persistent (HHS-HCC)  DVT (deep venous thrombosis) (CMS/HHS-HCC)  History of cancer   Patient Active Problem List  Diagnosis  Back pain  Other abnormal glucose   Past Surgical History:  Procedure Laterality Date  CHOLECYSTECTOMY  HERNIA REPAIR  HYSTERECTOMY  JOINT REPLACEMENT    Allergies  Allergen Reactions  Meprobamate Other (See Comments)  Forgets to breathe  Other Other (See Comments)  Patient is allergic to low molecular iron  Penicillins Other (See Comments)  Told never to take it based on allergy test as a child  Tiagabine Syncope   Current Outpatient Medications on File Prior to Visit  Medication Sig Dispense Refill  ibuprofen (MOTRIN) 200 MG tablet Take 200 mg by mouth every 6 (six) hours as needed for Pain  oxyCODONE-acetaminophen (PERCOCET) 5-325 mg tablet Take 1 tablet by mouth every 6 (six) hours as needed for Pain  peg 400-propylene glycol, PF, (SYSTANE ULTRA) 0.4-0.3 % ophthalmic drops Place 1 drop into both eyes as needed for Dry Eyes   No current facility-administered medications on file prior  to visit.   Family History  Problem Relation Age of Onset  Breast cancer Mother  Diabetes Father    Social History   Tobacco Use  Smoking Status Never  Smokeless Tobacco Never    Social History   Socioeconomic History  Marital status: Single  Tobacco Use  Smoking status: Never  Smokeless tobacco: Never  Substance and Sexual Activity  Alcohol use: Not Currently  Drug use: Not Currently   Objective:  There were no vitals filed for this visit.  There is no height or weight on file to calculate BMI.  Physical Exam HENT:  Head: Normocephalic.  Cardiovascular:  Rate and Rhythm: Normal rate.  Chest:  Breasts: Right: No bleeding, inverted nipple or mass.  Left: No bleeding, inverted nipple or mass.   Comments: Bruising noted bilateral breasts. No masses noted. Nipples normal. Musculoskeletal:  General: Normal range of motion.  Lymphadenopathy:  Upper Body:  Right upper body: No supraclavicular or axillary adenopathy.  Left upper body: No supraclavicular or axillary adenopathy.  Skin: General: Skin is warm.  Neurological:  General: No focal deficit present.  Mental Status: She is alert.  Psychiatric:  Mood and Affect: Mood normal.     Labs, Imaging and Diagnostic Testing:  CLINICAL DATA: 77 year old female presents for further evaluation of RIGHT breast calcifications identified on screening mammogram.  EXAM: DIGITAL DIAGNOSTIC UNILATERAL RIGHT MAMMOGRAM  TECHNIQUE: Right digital diagnostic mammography was performed.  COMPARISON: Previous exam(s).  ACR Breast Density Category b: There are scattered areas of fibroglandular density.  FINDINGS: Full field and magnification views of the RIGHT breast demonstrate a 1.8 cm group of pleomorphic calcifications  within the UPPER OUTER RIGHT breast.  IMPRESSION: 1.8 cm group of indeterminate UPPER-OUTER RIGHT breast calcifications. Tissue sampling is recommended.  RECOMMENDATION: Stereotactic guided RIGHT  breast biopsy, which will be scheduled.  I have discussed the findings and recommendations with the patient. If applicable, a reminder letter will be sent to the patient regarding the next appointment.  BI-RADS CATEGORY 4: Suspicious.   Electronically Signed By: Harmon Pier M.D. On: 09/28/2022 14:32  Diagnosis Breast, right, needle core biopsy, upper outer DUCTAL CARCINOMA IN SITU, INTERMEDIATE GRADE NECROSIS: PRESENT CALCIFICATIONS: PRESENT DCIS LENGTH: 0.4 CM SEE NOTE Diagnosis Note Note: Dr. Venetia Night reviewed the case and concurs with the interpretation. A breast prognostic profile (ER and PR) is pending and will be reported in an addendum. [facility] was notified on 10/03/2022. Haiyan MD Lu Pathologist, Electronic Signature (Case signed 10/03/2022) Specimen Gross and Clinical Information Specimen Comment TIF: 710 am CIT: < 5 min, calcs Specimen(s) Obtained: Breast, right, needle core biopsy, upper outer  Assessment and Plan:   Diagnoses and all orders for this visit:  Ductal carcinoma in situ (DCIS) of right breast   Discussed breast conserving surgery versus mastectomy with reconstruction. Long-term survival, local regional recurrence, complications, long-term expectations, quality life and other treatments reviewed. She is opted for right breast seed localized lumpectomyThe procedure has been discussed with the patient. Alternatives to surgery have been discussed with the patient. Risks of surgery include bleeding, Infection, Seroma formation, death, and the need for further surgery. The patient understands and wishes to proceed. Hayden Rasmussen, MD

## 2022-11-02 NOTE — Anesthesia Postprocedure Evaluation (Signed)
Anesthesia Post Note  Patient: Ashlee Sanford  Procedure(s) Performed: RIGHT BREAST LUMPECTOMY WITH RADIOACTIVE SEED LOCALIZATION (Right: Breast)     Anesthesia Type: General Anesthetic complications: no   No notable events documented.  Last Vitals:  Vitals:   11/02/22 0748 11/02/22 1100  BP: (!) 151/79 (!) 108/52  Pulse: 92 78  Resp: 18 13  Temp: 36.6 C 36.6 C  SpO2: 98% 99%    Last Pain:  Vitals:   11/02/22 0800  TempSrc:   PainSc: 0-No pain                 Evlyn Courier

## 2022-11-02 NOTE — H&P (Signed)
Chief Complaint: Breast Cancer  History of Present Illness: Ashlee Sanford is a 77 y.o. female who is seen today as an office consultation for evaluation of Breast Cancer  Patient presents to the [facility] today for evaluation of abnormal right mammogram. She had a 1.8 cm cluster of suspicious microcalcifications upper outer quadrant core biopsy to be consistent with DCIS. Intermediate grade, ER positive PR positive. She has no other complaints. She has multiple first-degree relatives with breast cancer and prostate cancer.  Review of Systems: A complete review of systems was obtained from the patient. I have reviewed this information and discussed as appropriate with the patient. See HPI as well for other ROS.    Medical History: Past Medical History:  Diagnosis Date  Anxiety  Asthma, unspecified asthma severity, unspecified whether complicated, unspecified whether persistent (HHS-HCC)  DVT (deep venous thrombosis) (CMS/HHS-HCC)  History of cancer   Patient Active Problem List  Diagnosis  Back pain  Other abnormal glucose   Past Surgical History:  Procedure Laterality Date  CHOLECYSTECTOMY  HERNIA REPAIR  HYSTERECTOMY  JOINT REPLACEMENT    Allergies  Allergen Reactions  Meprobamate Other (See Comments)  Forgets to breathe  Other Other (See Comments)  Patient is allergic to low molecular iron  Penicillins Other (See Comments)  Told never to take it based on allergy test as a child  Tiagabine Syncope   Current Outpatient Medications on File Prior to Visit  Medication Sig Dispense Refill  ibuprofen (MOTRIN) 200 MG tablet Take 200 mg by mouth every 6 (six) hours as needed for Pain  oxyCODONE-acetaminophen (PERCOCET) 5-325 mg tablet Take 1 tablet by mouth every 6 (six) hours as needed for Pain  peg 400-propylene glycol, PF, (SYSTANE ULTRA) 0.4-0.3 % ophthalmic drops Place 1 drop into both eyes as needed for Dry Eyes   No current facility-administered medications on file prior  to visit.   Family History  Problem Relation Age of Onset  Breast cancer Mother  Diabetes Father    Social History   Tobacco Use  Smoking Status Never  Smokeless Tobacco Never    Social History   Socioeconomic History  Marital status: Single  Tobacco Use  Smoking status: Never  Smokeless tobacco: Never  Substance and Sexual Activity  Alcohol use: Not Currently  Drug use: Not Currently   Objective:  There were no vitals filed for this visit.  There is no height or weight on file to calculate BMI.  Physical Exam HENT:  Head: Normocephalic.  Cardiovascular:  Rate and Rhythm: Normal rate.  Chest:  Breasts: Right: No bleeding, inverted nipple or mass.  Left: No bleeding, inverted nipple or mass.   Comments: Bruising noted bilateral breasts. No masses noted. Nipples normal. Musculoskeletal:  General: Normal range of motion.  Lymphadenopathy:  Upper Body:  Right upper body: No supraclavicular or axillary adenopathy.  Left upper body: No supraclavicular or axillary adenopathy.  Skin: General: Skin is warm.  Neurological:  General: No focal deficit present.  Mental Status: She is alert.  Psychiatric:  Mood and Affect: Mood normal.     Labs, Imaging and Diagnostic Testing:  CLINICAL DATA: 77-year-old female presents for further evaluation of RIGHT breast calcifications identified on screening mammogram.  EXAM: DIGITAL DIAGNOSTIC UNILATERAL RIGHT MAMMOGRAM  TECHNIQUE: Right digital diagnostic mammography was performed.  COMPARISON: Previous exam(s).  ACR Breast Density Category b: There are scattered areas of fibroglandular density.  FINDINGS: Full field and magnification views of the RIGHT breast demonstrate a 1.8 cm group of pleomorphic calcifications   within the UPPER OUTER RIGHT breast.  IMPRESSION: 1.8 cm group of indeterminate UPPER-OUTER RIGHT breast calcifications. Tissue sampling is recommended.  RECOMMENDATION: Stereotactic guided RIGHT  breast biopsy, which will be scheduled.  I have discussed the findings and recommendations with the patient. If applicable, a reminder letter will be sent to the patient regarding the next appointment.  BI-RADS CATEGORY 4: Suspicious.   Electronically Signed By: Ashlee Sanford M.D. On: 09/28/2022 14:32  Diagnosis Breast, right, needle core biopsy, upper outer DUCTAL CARCINOMA IN SITU, INTERMEDIATE GRADE NECROSIS: PRESENT CALCIFICATIONS: PRESENT DCIS LENGTH: 0.4 CM SEE NOTE Diagnosis Note Note: Dr. Picklesimer reviewed the case and concurs with the interpretation. A breast prognostic profile (ER and PR) is pending and will be reported in an addendum. [facility] was notified on 10/03/2022. Haiyan MD Lu Pathologist, Electronic Signature (Case signed 10/03/2022) Specimen Gross and Clinical Information Specimen Comment TIF: 710 am CIT: < 5 min, calcs Specimen(s) Obtained: Breast, right, needle core biopsy, upper outer  Assessment and Plan:   Diagnoses and all orders for this visit:  Ductal carcinoma in situ (DCIS) of right breast   Discussed breast conserving surgery versus mastectomy with reconstruction. Long-term survival, local regional recurrence, complications, long-term expectations, quality life and other treatments reviewed. She is opted for right breast seed localized lumpectomyThe procedure has been discussed with the patient. Alternatives to surgery have been discussed with the patient. Risks of surgery include bleeding, Infection, Seroma formation, death, and the need for further surgery. The patient understands and wishes to proceed. .   Macarena Langseth ANTHONY Illyria Sobocinski, MD  

## 2022-11-02 NOTE — Interval H&P Note (Signed)
History and Physical Interval Note:  11/02/2022 9:39 AM  Ashlee Sanford  has presented today for surgery, with the diagnosis of RIGHT BREAST DCIS.  The various methods of treatment have been discussed with the patient and family. After consideration of risks, benefits and other options for treatment, the patient has consented to  Procedure(s): RIGHT BREAST LUMPECTOMY WITH RADIOACTIVE SEED LOCALIZATION (Right) as a surgical intervention.  The patient's history has been reviewed, patient examined, no change in status, stable for surgery.  I have reviewed the patient's chart and labs.  Questions were answered to the patient's satisfaction.     [name]

## 2022-11-02 NOTE — Anesthesia Preprocedure Evaluation (Addendum)
Anesthesia Evaluation  Patient identified by MRN, date of birth, ID band Patient awake    Reviewed: Allergy & Precautions, H&P , NPO status , Patient's Chart, lab work & pertinent test results  History of Anesthesia Complications (+) PONV and history of anesthetic complications (does well w/ scop patch)  Airway Mallampati: III  TM Distance: >3 FB Neck ROM: Full    Dental  (+) Partial Upper   Pulmonary neg pulmonary ROS   Pulmonary exam normal breath sounds clear to auscultation       Cardiovascular hypertension (151/79 preop, not on home meds- per pt normally 120/79), Normal cardiovascular exam Rhythm:Regular Rate:Normal     Neuro/Psych  PSYCHIATRIC DISORDERS Anxiety     negative neurological ROS     GI/Hepatic negative GI ROS, Neg liver ROS,,,  Endo/Other  negative endocrine ROS    Renal/GU negative Renal ROS  negative genitourinary   Musculoskeletal negative musculoskeletal ROS (+)    Abdominal   Peds negative pediatric ROS (+)  Hematology negative hematology ROS (+)   Anesthesia Other Findings R breast DCIS  Reproductive/Obstetrics negative OB ROS                             Anesthesia Physical Anesthesia Plan  ASA: 2  Anesthesia Plan: General   Post-op Pain Management: Tylenol PO (pre-op)*   Induction: Intravenous  PONV Risk Score and Plan: 4 or greater and Ondansetron, Dexamethasone, Treatment may vary due to age or medical condition and Scopolamine patch - Pre-op  Airway Management Planned: LMA  Additional Equipment: None  Intra-op Plan:   Post-operative Plan: Extubation in OR  Informed Consent: I have reviewed the patients History and Physical, chart, labs and discussed the procedure including the risks, benefits and alternatives for the proposed anesthesia with the patient or authorized representative who has indicated his/her understanding and acceptance.      Dental advisory given  Plan Discussed with: CRNA  Anesthesia Plan Comments: (D/w pt side effects of scop patch, pt aware and would like to proceed w/ it)       Anesthesia Quick Evaluation

## 2022-11-02 NOTE — Discharge Instructions (Signed)
[facility] Office Phone Number [phone]  BREAST BIOPSY/ PARTIAL MASTECTOMY: POST OP INSTRUCTIONS  Always review your discharge instruction sheet given to you by the facility where your surgery was performed.  IF YOU HAVE DISABILITY OR FAMILY LEAVE FORMS, YOU MUST BRING THEM TO THE OFFICE FOR PROCESSING.  DO NOT GIVE THEM TO YOUR DOCTOR.  A prescription for pain medication may be given to you upon discharge.  Take your pain medication as prescribed, if needed.  If narcotic pain medicine is not needed, then you may take acetaminophen (Tylenol) or ibuprofen (Advil) as needed. Take your usually prescribed medications unless otherwise directed If you need a refill on your pain medication, please contact your pharmacy.  They will contact our office to request authorization.  Prescriptions will not be filled after 5pm or on week-ends. You should eat very light the first 24 hours after surgery, such as soup, crackers, pudding, etc.  Resume your normal diet the day after surgery. Most patients will experience some swelling and bruising in the breast.  Ice packs and a good support bra will help.  Swelling and bruising can take several days to resolve.  It is common to experience some constipation if taking pain medication after surgery.  Increasing fluid intake and taking a stool softener will usually help or prevent this problem from occurring.  A mild laxative (Milk of Magnesia or Miralax) should be taken according to package directions if there are no bowel movements after 48 hours. Unless discharge instructions indicate otherwise, you may remove your bandages 24-48 hours after surgery, and you may shower at that time.  You may have steri-strips (small skin tapes) in place directly over the incision.  These strips should be left on the skin for 7-10 days.  If your surgeon used skin glue on the incision, you may shower in 24 hours.  The glue will flake off over the next 2-3 weeks.  Any  sutures or staples will be removed at the office during your follow-up visit. ACTIVITIES:  You may resume regular daily activities (gradually increasing) beginning the next day.  Wearing a good support bra or sports bra minimizes pain and swelling.  You may have sexual intercourse when it is comfortable. You may drive when you no longer are taking prescription pain medication, you can comfortably wear a seatbelt, and you can safely maneuver your car and apply brakes. RETURN TO WORK:  ______________________________________________________________________________________ You should see your doctor in the office for a follow-up appointment approximately two weeks after your surgery.  Your doctor's nurse will typically make your follow-up appointment when she calls you with your pathology report.  Expect your pathology report 2-3 business days after your surgery.  You may call to check if you do not hear from us after three days. OTHER INSTRUCTIONS: _______________________________________________________________________________________________ _____________________________________________________________________________________________________________________________________ _____________________________________________________________________________________________________________________________________ _____________________________________________________________________________________________________________________________________  WHEN TO CALL YOUR DOCTOR: Fever over 101.0 Nausea and/or vomiting. Extreme swelling or bruising. Continued bleeding from incision. Increased pain, redness, or drainage from the incision.  The clinic staff is available to answer your questions during regular business hours.  Please don't hesitate to call and ask to speak to one of the nurses for clinical concerns.  If you have a medical emergency, go to the nearest emergency room or call 911.  A surgeon from [facility] is always on call at the hospital.  For further questions, please visit [email or web address]   

## 2022-11-02 NOTE — Transfer of Care (Signed)
Immediate Anesthesia Transfer of Care Note  Patient: Ashlee Sanford  Procedure(s) Performed: RIGHT BREAST LUMPECTOMY WITH RADIOACTIVE SEED LOCALIZATION (Right: Breast)  Patient Location: PACU  Anesthesia Type:General  Level of Consciousness: alert   Airway & Oxygen Therapy: Patient connected to face mask  Post-op Assessment: Report given to RN and Post -op Vital signs reviewed and stable  Post vital signs: Reviewed  Last Vitals:  Vitals Value Taken Time  BP 108/52 11/02/22 1100  Temp    Pulse 75 11/02/22 1106  Resp 9 11/02/22 1106  SpO2 96 % 11/02/22 1106  Vitals shown include unvalidated device data.  Last Pain:  Vitals:   11/02/22 0800  TempSrc:   PainSc: 0-No pain         Complications: No notable events documented.

## 2022-11-03 ENCOUNTER — Encounter (HOSPITAL_COMMUNITY): Payer: Self-pay | Admitting: Surgery

## 2022-11-06 LAB — SURGICAL PATHOLOGY

## 2022-11-07 ENCOUNTER — Encounter: Payer: Self-pay | Admitting: Surgery

## 2022-11-09 ENCOUNTER — Encounter: Payer: Self-pay | Admitting: *Deleted

## 2022-11-09 ENCOUNTER — Telehealth: Payer: Self-pay | Admitting: Radiation Oncology

## 2022-11-09 NOTE — Telephone Encounter (Signed)
6/20 @ 1:42 pm Left voicemail for patient to call our office to be reschedule for her consult and ct sim appt at a  later time on 6/17 due to Dr. Roselind Messier being in OR same time of pt's appt. -per Darryl Nestle.

## 2022-11-10 ENCOUNTER — Telehealth: Payer: Self-pay | Admitting: Radiation Oncology

## 2022-11-10 NOTE — Telephone Encounter (Signed)
6/21 @ 8:55 am spoke to patient to be rescheduled for later time on 7/17 due to Dr. Roselind Messier in the OR.  Patient accepted 1:00 pm appointment but wanted to hold off on her ct sim/treatment planning appt for same day till after she speak to Dr. Roselind Messier.  She has questions and concerns. CT Sim appt has been cancel till further notice.

## 2022-11-12 NOTE — Progress Notes (Signed)
Patient Care Team: Ollen Bowl, MD as PCP - General (Internal Medicine) Serena Croissant, MD as Consulting Physician (Hematology and Oncology) Harriette Bouillon, MD as Consulting Physician (General Surgery) Antony Blackbird, MD as Consulting Physician (Radiation Oncology) Pershing Proud, RN as Oncology Nurse Navigator Donnelly Angelica, RN as Oncology Nurse Navigator  DIAGNOSIS: No diagnosis found.  SUMMARY OF ONCOLOGIC HISTORY: Oncology History  Ductal carcinoma in situ (DCIS) of right breast  09/30/2022 Initial Diagnosis   Screening mammogram detected right breast calcifications UOQ 1.8 cm: Intermediate grade DCIS with necrosis ER 100% PR 95%   10/11/2022 Cancer Staging   Staging form: Breast, AJCC 8th Edition - Clinical: Stage 0 (cTis (DCIS), cN0, cM0, G2, ER+, PR+, HER2: Not Assessed) - Signed by Serena Croissant, MD on 10/11/2022 Stage prefix: Initial diagnosis Histologic grading system: 3 grade system    Genetic Testing   [facility] Custom Panel+RNA was Negative. Of note, a variant of uncertain significance was detected in the BRCA2 gene (c.9159G>T). Report date is 10/19/2022.  The Custom Hereditary Cancers Panel offered by [facility] includes sequencing and/or deletion duplication testing of the following 50 genes: APC, ATM, AXIN2, BAP1, BARD1, BMPR1A, BRCA1, BRCA2, BRIP1, CDH1, CDK4, CDKN2A (p14ARF and p16INK4a only), CHEK2, CTNNA1, DICER1, EPCAM (Deletion/duplication testing only), FH, GREM1 (promoter region duplication testing only), HOXB13, KIT, MBD4, MEN1, MLH1, MSH2, MSH3, MSH6, MUTYH, NF1, NHTL1, PALB2, PDGFRA, PMS2, POLD1, POLE, PRKAR1A, PTEN, RAD51C, RAD51D, RET, SDHA (sequencing analysis only except exon 14), SDHB, SDHC, SDHD, SMAD4, SMARCA4. STK11, TP53, TSC1, TSC2, and VHL.     CHIEF COMPLIANT: Follow-up after surgery  INTERVAL HISTORY: Ashlee Sanford is a 77 y.o. female is here because of recent diagnosis of right breast DCIS. She presents to the clinic for a  follow-up.    ALLERGIES:  is allergic to meprobamate, other, dilaudid [hydromorphone hcl], penicillins, and tiagabine.  MEDICATIONS:  Current Outpatient Medications  Medication Sig Dispense Refill   CALCIUM-VITAMIN D PO Take 1 drop by mouth 3 (three) times a week.     clonazePAM (KLONOPIN) 0.5 MG tablet Take 0.5 mg by mouth daily as needed for anxiety.     clotrimazole-betamethasone (LOTRISONE) cream Apply 1 Application topically daily as needed (irritation).     Dextran 70-Hypromellose (ARTIFICIAL TEARS PF OP) Place 1 drop into both eyes as needed (dry eyes).     Docusate Sodium (DSS) 100 MG CAPS Take 100 mg by mouth daily.     ibuprofen (ADVIL) 800 MG tablet Take 1 tablet (800 mg total) by mouth every 8 (eight) hours as needed. 30 tablet 0   MAGNESIUM GLYCINATE PO Take 1 capsule by mouth daily.     oxyCODONE (OXY IR/ROXICODONE) 5 MG immediate release tablet Take 1 tablet (5 mg total) by mouth every 6 (six) hours as needed for severe pain. 15 tablet 0   No current facility-administered medications for this visit.    PHYSICAL EXAMINATION: ECOG PERFORMANCE STATUS: {CHL ONC ECOG PS:[phone]}  There were no vitals filed for this visit. There were no vitals filed for this visit.  BREAST:*** No palpable masses or nodules in either right or left breasts. No palpable axillary supraclavicular or infraclavicular adenopathy no breast tenderness or nipple discharge. (exam performed in the presence of a chaperone)  LABORATORY DATA:  I have reviewed the data as listed    Latest Ref Rng & Units 10/11/2022   12:17 PM 04/22/2012    4:00 PM 10/21/2008    8:53 AM  CMP  Glucose 70 - 99 mg/dL  131  86  112   BUN 8 - 23 mg/dL 10  10  10    Creatinine 0.44 - 1.00 mg/dL 1.61  0.96  0.7   Sodium 135 - 145 mmol/L 136  137  144   Potassium 3.5 - 5.1 mmol/L 3.9  3.9  4.3   Chloride 98 - 111 mmol/L 103  99  111   CO2 22 - 32 mmol/L 28  28  30    Calcium 8.9 - 10.3 mg/dL 9.5  9.7  9.3   Total Protein 6.5  - 8.1 g/dL 7.4  7.7  7.6   Total Bilirubin 0.3 - 1.2 mg/dL 0.5  0.3  0.9   Alkaline Phos 38 - 126 U/L 85  95  94   AST 15 - 41 U/L 15  21  23    ALT 0 - 44 U/L 11  15  24      Lab Results  Component Value Date   WBC 7.7 10/11/2022   HGB 13.7 10/11/2022   HCT 39.2 10/11/2022   MCV 86.3 10/11/2022   PLT 231 10/11/2022   NEUTROABS 4.5 10/11/2022    ASSESSMENT & PLAN:  No problem-specific Assessment & Plan notes found for this encounter.    No orders of the defined types were placed in this encounter.  The patient has a good understanding of the overall plan. she agrees with it. she will call with any problems that may develop before the next visit here. Total time spent: 30 mins including face to face time and time spent for planning, charting and co-ordination of care   Sherlyn Lick, CMA 11/12/22    I Janan Ridge am acting as a Neurosurgeon for [employer]  ***

## 2022-11-15 ENCOUNTER — Inpatient Hospital Stay: Payer: PPO | Attending: Hematology and Oncology | Admitting: Hematology and Oncology

## 2022-11-15 ENCOUNTER — Encounter: Payer: Self-pay | Admitting: *Deleted

## 2022-11-15 VITALS — BP 172/92 | HR 106 | Temp 97.7°F | Resp 18 | Ht 63.0 in | Wt 152.3 lb

## 2022-11-15 DIAGNOSIS — D0511 Intraductal carcinoma in situ of right breast: Secondary | ICD-10-CM | POA: Insufficient documentation

## 2022-11-15 MED ORDER — TAMOXIFEN CITRATE 10 MG PO TABS: 10.0000 mg | ORAL_TABLET | Freq: Every day | ORAL | 3 refills | Status: DC

## 2022-11-15 NOTE — Assessment & Plan Note (Addendum)
11/02/2022: Right lumpectomy: Grade 2 DCIS with necrosis, solid and cribriform type, 9 mm, margins negative, ER 100%, PR 95%  Pathology counseling: I discussed the final pathology report of the patient provided  a copy of this report. I discussed the margins.  We also discussed the final staging along with previously performed ER/PR testing.  Treatment plan: Adjuvant radiation therapy (patient thought about all her options and made a decision that she does not want to go through radiation) antiestrogen therapy with tamoxifen 5 years  Return to clinic in 3 months for survivorship care plan visit

## 2022-11-21 ENCOUNTER — Encounter: Payer: Self-pay | Admitting: *Deleted

## 2022-11-21 ENCOUNTER — Encounter: Payer: Self-pay | Admitting: Internal Medicine

## 2022-12-06 ENCOUNTER — Ambulatory Visit: Payer: PPO

## 2022-12-06 ENCOUNTER — Ambulatory Visit: Payer: PPO | Admitting: Radiation Oncology

## 2023-01-14 ENCOUNTER — Ambulatory Visit
Admission: EM | Admit: 2023-01-14 | Discharge: 2023-01-14 | Disposition: A | Discharge status: Home / Self Care | Payer: PPO | Attending: Emergency Medicine | Admitting: Emergency Medicine

## 2023-01-14 DIAGNOSIS — B9689 Other specified bacterial agents as the cause of diseases classified elsewhere: Secondary | ICD-10-CM

## 2023-01-14 DIAGNOSIS — H109 Unspecified conjunctivitis: Secondary | ICD-10-CM | POA: Diagnosis not present

## 2023-01-14 MED ORDER — POLYMYXIN B-TRIMETHOPRIM 10000-0.1 UNIT/ML-% OP SOLN: 1.0000 [drp] | Freq: Four times a day (QID) | OPHTHALMIC | 0 refills | Status: AC

## 2023-01-14 NOTE — ED Triage Notes (Signed)
Patient to Urgent Care with complaints of right eye redness and drainage that started Friday but has had watering and itching for several weeks. Woke up with the same symptoms in her left eye this morning.

## 2023-01-14 NOTE — ED Provider Notes (Signed)
[name]    CSN: [id number] Arrival date & time: 01/14/23  4098      History   Chief Complaint Chief Complaint  Patient presents with   Eye Problem    HPI ATYANA SUPINGER is a 77 y.o. female.  Patient presents with right eye redness and drainage x 2 days.  She woke up this morning with similar symptoms in her left eye now.  No eye trauma, eye pain, change in vision, fever, chills, or other symptoms.  No treatments at home.  The history is provided by the patient and medical records.    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   BACK PAIN 02/19/2009   OTHER AND UNSPECIFIED HYPERLIPIDEMIA 10/21/2008   HYPERGLYCEMIA 06/30/2008    Past Surgical History:  Procedure Laterality Date   BREAST BIOPSY Right 09/30/2022   MM RT BREAST BX W LOC DEV 1ST LESION IMAGE BX SPEC STEREO GUIDE 09/30/2022 [facility]   BREAST BIOPSY  10/31/2022   MM RT RADIOACTIVE SEED LOC MAMMO GUIDE 10/31/2022 [facility]   BREAST LUMPECTOMY WITH RADIOACTIVE SEED LOCALIZATION Right 11/02/2022   Procedure: RIGHT BREAST LUMPECTOMY WITH RADIOACTIVE SEED LOCALIZATION;  Surgeon: Harriette Bouillon, MD;  Location: [facility];  Service: General;  Laterality: Right;   CATARACT EXTRACTION     bilateral   CHOLECYSTECTOMY     CYSTOCELE REPAIR  04/25/2012   Procedure: ANTERIOR REPAIR (CYSTOCELE);  Surgeon: Dorien Chihuahua. Richardson Dopp, MD;  Location: [facility];  Service: Gynecology;  Laterality:  N/A;   DILATION AND CURETTAGE OF UTERUS     HERNIA REPAIR     incisional hernia repair   ROBOTIC ASSISTED TOTAL HYSTERECTOMY  04/25/2012   Procedure: ROBOTIC ASSISTED TOTAL HYSTERECTOMY;  Surgeon: Dorien Chihuahua. Richardson Dopp, MD;  Location: [facility];  Service: Gynecology;  Laterality: N/A;   SALPINGOOPHORECTOMY  04/25/2012   Procedure: SALPINGO OOPHORECTOMY;  Surgeon: Dorien Chihuahua. Richardson Dopp, MD;  Location: [facility];  Service: Gynecology;  Laterality: Bilateral;   TONSILLECTOMY     TUBAL LIGATION     WRIST SURGERY  05/22/2006    OB History   No obstetric history on file.      Home Medications    Prior to Admission medications   Medication Sig Start Date End Date Taking? Authorizing Provider  trimethoprim-polymyxin b (POLYTRIM) ophthalmic solution Place 1 drop into both eyes 4 (four) times daily for 7 days. 01/14/23 01/21/23 Yes Mickie Bail, NP  CALCIUM-VITAMIN D PO Take 1 drop by mouth 3 (three) times a week.    [provider]  clonazePAM (KLONOPIN) 0.5 MG tablet Take 0.5 mg by mouth daily as needed for anxiety.    [provider]  clotrimazole-betamethasone (LOTRISONE) cream Apply 1 Application topically daily as needed (irritation).    [provider]  Dextran 70-Hypromellose (ARTIFICIAL TEARS PF OP) Place 1 drop into both  eyes as needed (dry eyes).    [provider]  Docusate Sodium (DSS) 100 MG CAPS Take 100 mg by mouth daily.    [provider]  ibuprofen (ADVIL) 800 MG tablet Take 1 tablet (800 mg total) by mouth every 8 (eight) hours as needed. 11/02/22   Cornett, Maisie Fus, MD  MAGNESIUM GLYCINATE PO Take 1 capsule by mouth daily.    [provider]  oxyCODONE (OXY IR/ROXICODONE) 5 MG immediate release tablet Take 1 tablet (5 mg total) by mouth every 6 (six) hours as needed for severe pain. 11/02/22   Cornett, Maisie Fus, MD  tamoxifen (NOLVADEX) 10 MG tablet Take 1 tablet (10 mg total) by mouth daily. 11/15/22   Serena Croissant, MD    Family History Family  History  Problem Relation Age of Onset   Breast cancer Mother 51   Prostate cancer Father        metastatic   Prostate cancer Maternal Grandfather 78 - 79       metastatic   Breast cancer Maternal Aunt 50   Prostate cancer Maternal Uncle 47 - 89   Breast cancer Cousin 55 - 4       maternal first cousin   Thyroid cancer Daughter 72 - 57   Thyroid cancer Granddaughter 51    Social History Social History   Tobacco Use   Smoking status: Never  Vaping Use   Vaping status: Never Used  Substance Use Topics   Alcohol use: No   Drug use: No     Allergies   Meprobamate, Other, Dilaudid [hydromorphone hcl], Penicillins, and Tiagabine   Review of Systems Review of Systems  Constitutional:  Negative for chills and fever.  HENT:  Negative for ear pain and sore throat.   Eyes:  Positive for discharge and redness. Negative for pain and visual disturbance.  Respiratory:  Negative for cough and shortness of breath.   Skin:  Negative for color change and rash.     Physical Exam Triage Vital Signs ED Triage Vitals  Encounter Vitals Group     BP 01/14/23 1020 (!) 155/90     Systolic BP Percentile --      Diastolic BP Percentile --      Pulse Rate 01/14/23 1014 98     Resp 01/14/23 1014 18     Temp 01/14/23 1014 98.2 F (36.8 C)     Temp src --      SpO2 01/14/23 1014 98 %     Weight --      Height --      Head Circumference --      Peak Flow --      Pain Score 01/14/23 1015 1     Pain Loc --      Pain Education --      Exclude from Growth Chart --    No data found.  Updated Vital Signs BP (!) 155/90   Pulse 98   Temp 98.2 F (36.8 C)   Resp 18   SpO2 98%   Visual Acuity Right Eye Distance:   Left Eye Distance:   Bilateral Distance:    Right Eye Near:   Left Eye Near:    Bilateral Near:     Physical Exam Vitals and nursing note reviewed.  Constitutional:      General: She is not in acute distress.    Appearance: She is well-developed.  HENT:      Right Ear: Tympanic membrane normal.  Left Ear: Tympanic membrane normal.     Nose: Nose normal.     Mouth/Throat:     Mouth: Mucous membranes are moist.     Pharynx: Oropharynx is clear.  Eyes:     General: Lids are normal. Vision grossly intact.     Conjunctiva/sclera:     Right eye: Right conjunctiva is injected.     Left eye: Left conjunctiva is injected.     Pupils: Pupils are equal, round, and reactive to light.  Cardiovascular:     Rate and Rhythm: Normal rate and regular rhythm.     Heart sounds: Normal heart sounds.  Pulmonary:     Effort: Pulmonary effort is normal. No respiratory distress.     Breath sounds: Normal breath sounds.  Musculoskeletal:     Cervical back: Neck supple.  Skin:    General: Skin is warm and dry.  Neurological:     Mental Status: She is alert.  Psychiatric:        Mood and Affect: Mood normal.        Behavior: Behavior normal.      UC Treatments / Results  Labs (all labs ordered are listed, but only abnormal results are displayed) Labs Reviewed - No data to display  EKG   Radiology No results found.  Procedures Procedures (including critical care time)  Medications Ordered in UC Medications - No data to display  Initial Impression / Assessment and Plan / UC Course  I have reviewed the triage vital signs and the nursing notes.  Pertinent labs & imaging results that were available during my care of the patient were reviewed by me and considered in my medical decision making (see chart for details).    Bilateral bacterial conjunctivitis.  Treating with Polytrim eyedrops.  Education provided on conjunctivitis.  Instructed patient to follow-up with PCP if symptoms are not improving.  ED precautions discussed.  Patient agrees to plan of care.   Final Clinical Impressions(s) / UC Diagnoses   Final diagnoses:  Bacterial conjunctivitis of both eyes     Discharge Instructions      Use the antibiotic eyedrops as prescribed.     Follow-up with your primary care provider if your symptoms are not improving.    Go to the emergency department if you have acute eye pain, changes in your vision, or other concerning symptoms.        ED Prescriptions     Medication Sig Dispense Auth. Provider   trimethoprim-polymyxin b (POLYTRIM) ophthalmic solution Place 1 drop into both eyes 4 (four) times daily for 7 days. 10 mL Mickie Bail, NP      PDMP not reviewed this encounter.   Mickie Bail, NP 01/14/23 1039

## 2023-01-14 NOTE — Discharge Instructions (Addendum)
Use the antibiotic eyedrops as prescribed.    Follow-up with your primary care provider if your symptoms are not improving.    Go to the emergency department if you have acute eye pain, changes in your vision, or other concerning symptoms.    

## 2023-01-23 ENCOUNTER — Ambulatory Visit
Admission: EM | Admit: 2023-01-23 | Discharge: 2023-01-23 | Disposition: A | Discharge status: Home / Self Care | Payer: PPO

## 2023-01-23 DIAGNOSIS — H109 Unspecified conjunctivitis: Secondary | ICD-10-CM

## 2023-01-23 MED ORDER — OFLOXACIN 0.3 % OP SOLN: 1.0000 [drp] | Freq: Four times a day (QID) | OPHTHALMIC | 0 refills | Status: DC

## 2023-01-23 NOTE — ED Triage Notes (Signed)
Patient to Urgent Care with complaints of eye redness and drainage that started ten days ago. Reports her right eye is matted shut when she wakes up. Having some drainage in her left eye.   Previously prescribed Polytrim eyedrops provided little relief. Using Zyrtec.

## 2023-01-23 NOTE — ED Provider Notes (Signed)
Ashlee Sanford    CSN: [id number] Arrival date & time: 01/23/23  1324      History   Chief Complaint Chief Complaint  Patient presents with   Eye Problem    HPI Ashlee Sanford is a 77 y.o. female.  Patient presents with ongoing 10-day history of eye redness, drainage, matting in lashes.  Her symptoms are worse in the right eye.  She has been using Polytrim eyedrops without relief.  She also has been taking Zyrtec which did help reduce the amount of eye drainage.  She denies eye trauma, eye pain, change in vision, fever, chills, or other symptoms.  Patient was seen here on 01/14/2023; diagnosed with bacterial conjunctivitis; treated with Polytrim eyedrops.  The history is provided by the patient and medical records.    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   BACK PAIN 02/19/2009   OTHER AND UNSPECIFIED HYPERLIPIDEMIA 10/21/2008   HYPERGLYCEMIA 06/30/2008    Past Surgical History:  Procedure Laterality Date   BREAST BIOPSY Right 09/30/2022   MM RT BREAST BX W LOC DEV 1ST LESION IMAGE BX SPEC STEREO GUIDE 09/30/2022 [facility]   BREAST BIOPSY  10/31/2022   MM RT RADIOACTIVE SEED LOC MAMMO GUIDE 10/31/2022 [facility]   BREAST LUMPECTOMY WITH RADIOACTIVE SEED LOCALIZATION Right 11/02/2022   Procedure: RIGHT BREAST LUMPECTOMY WITH RADIOACTIVE SEED LOCALIZATION;  Surgeon: Harriette Bouillon, MD;  Location: [facility];  Service: General;   Laterality: Right;   CATARACT EXTRACTION     bilateral   CHOLECYSTECTOMY     CYSTOCELE REPAIR  04/25/2012   Procedure: ANTERIOR REPAIR (CYSTOCELE);  Surgeon: Dorien Chihuahua. Richardson Dopp, MD;  Location: [facility];  Service: Gynecology;  Laterality: N/A;   DILATION AND CURETTAGE OF UTERUS     HERNIA REPAIR     incisional hernia repair   ROBOTIC ASSISTED TOTAL HYSTERECTOMY  04/25/2012   Procedure: ROBOTIC ASSISTED TOTAL HYSTERECTOMY;  Surgeon: Dorien Chihuahua. Richardson Dopp, MD;  Location: [facility];  Service: Gynecology;  Laterality: N/A;   SALPINGOOPHORECTOMY  04/25/2012   Procedure: SALPINGO OOPHORECTOMY;  Surgeon: Dorien Chihuahua. Richardson Dopp, MD;  Location: [facility];  Service: Gynecology;  Laterality: Bilateral;   TONSILLECTOMY     TUBAL LIGATION     WRIST SURGERY  05/22/2006    OB History   No obstetric history on file.      Home Medications    Prior to Admission medications   Medication Sig Start Date End Date Taking? Authorizing Provider  ofloxacin (OCUFLOX) 0.3 % ophthalmic solution Place 1 drop into both eyes 4 (four) times daily. 01/23/23  Yes Mickie Bail, NP  CALCIUM-VITAMIN D PO Take 1 drop by mouth 3 (three) times a week.    [provider]  clonazePAM (KLONOPIN) 0.5 MG tablet Take 0.5 mg by mouth daily as needed for anxiety.    [provider]  clotrimazole-betamethasone (LOTRISONE) cream Apply 1 Application topically daily as needed (irritation).    [provider]  Dextran 70-Hypromellose (ARTIFICIAL TEARS PF OP) Place 1 drop into both eyes as needed (dry eyes).    [provider]  Docusate Sodium (DSS) 100 MG CAPS Take 100 mg by mouth daily.    [provider]  ibuprofen (ADVIL) 800 MG tablet Take 1 tablet (800 mg total) by mouth every 8 (eight) hours as needed. 11/02/22   Cornett, Maisie Fus, MD  MAGNESIUM GLYCINATE PO Take 1 capsule by mouth daily.    [provider]  oxyCODONE (OXY IR/ROXICODONE) 5 MG immediate release tablet Take 1 tablet (5 mg total) by mouth every 6  (six) hours as needed for severe pain. 11/02/22   Cornett, Maisie Fus, MD  tamoxifen (NOLVADEX) 10 MG tablet Take 1 tablet (10 mg total) by mouth daily. 11/15/22   Serena Croissant, MD    Family History Family History  Problem Relation Age of Onset   Breast cancer Mother 43   Prostate cancer Father        metastatic   Prostate cancer Maternal Grandfather 50 - 79       metastatic   Breast cancer Maternal Aunt 50   Prostate cancer Maternal Uncle 81 - 89   Breast cancer Cousin 10 - 85       maternal first cousin   Thyroid cancer Daughter 54 - 58   Thyroid cancer Granddaughter 83    Social History Social History   Tobacco Use   Smoking status: Never  Vaping Use   Vaping status: Never Used  Substance Use Topics   Alcohol use: No   Drug use: No     Allergies   Meprobamate, Other, Dilaudid [hydromorphone hcl], Penicillins, and Tiagabine   Review of Systems Review of Systems  Constitutional:  Negative for chills and fever.  HENT:  Negative for ear pain and sore throat.   Eyes:  Positive for discharge and redness. Negative for pain and visual disturbance.  Respiratory:  Negative for cough and shortness of breath.   Skin:  Negative for rash and wound.     Physical Exam Triage Vital Signs ED Triage Vitals  Encounter Vitals Group     BP 01/23/23 0833 (!) 146/83     Systolic BP Percentile --      Diastolic BP Percentile --      Pulse Rate 01/23/23 0823 (!) 106     Resp 01/23/23 0823 18     Temp 01/23/23 0823 98.1 F (36.7 C)     Temp src --      SpO2 01/23/23 0823 98 %     Weight --      Height --      Head Circumference --      Peak Flow --      Pain Score 01/23/23 0832 0     Pain Loc --      Pain Education --      Exclude from Growth Chart --    No data found.  Updated Vital Signs BP (!) 146/83   Pulse (!) 106   Temp 98.1 F (36.7 C)   Resp 18   SpO2 98%   Visual Acuity Right Eye Distance:   Left Eye Distance:   Bilateral Distance:    Right Eye Near:    Left Eye Near:    Bilateral Near:     Physical Exam Vitals and nursing note reviewed.  Constitutional:  General: She is not in acute distress.    Appearance: She is well-developed.  HENT:     Right Ear: Tympanic membrane normal.     Left Ear: Tympanic membrane normal.     Nose: Nose normal.     Mouth/Throat:     Mouth: Mucous membranes are moist.     Pharynx: Oropharynx is clear.  Eyes:     General: Lids are normal. Vision grossly intact.     Conjunctiva/sclera:     Right eye: Right conjunctiva is injected.     Left eye: Left conjunctiva is injected.     Pupils: Pupils are equal, round, and reactive to light.     Comments: Conjunctiva injected, R>L.  Tearing from right eye.  Cardiovascular:     Rate and Rhythm: Normal rate and regular rhythm.     Heart sounds: Normal heart sounds.  Pulmonary:     Effort: Pulmonary effort is normal. No respiratory distress.     Breath sounds: Normal breath sounds.  Musculoskeletal:     Cervical back: Neck supple.  Skin:    General: Skin is warm and dry.  Neurological:     Mental Status: She is alert.  Psychiatric:        Mood and Affect: Mood normal.        Behavior: Behavior normal.      UC Treatments / Results  Labs (all labs ordered are listed, but only abnormal results are displayed) Labs Reviewed - No data to display  EKG   Radiology No results found.  Procedures Procedures (including critical care time)  Medications Ordered in UC Medications - No data to display  Initial Impression / Assessment and Plan / UC Course  I have reviewed the triage vital signs and the nursing notes.  Pertinent labs & imaging results that were available during my care of the patient were reviewed by me and considered in my medical decision making (see chart for details).    Bacterial conjunctivitis.  Patient recently completed a course of Polytrim eyedrops without relief of her symptoms.  Changing the eyedrops to ofloxacin today and  instructed patient to schedule a follow-up appointment with her eye care provider (she goes to [facility]) in 1 to 2 days.  If she is unable to see her eye care provider, follow-up with her PCP.  Education provided on bacterial conjunctivitis.  Patient agrees to plan of care.  Final Clinical Impressions(s) / UC Diagnoses   Final diagnoses:  Bacterial conjunctivitis     Discharge Instructions      Use the antibiotic eyedrops as prescribed.    Follow-up with your primary care provider or eye care provider.    Go to the emergency department if you have acute eye pain, changes in your vision, or other concerning symptoms.        ED Prescriptions     Medication Sig Dispense Auth. Provider   ofloxacin (OCUFLOX) 0.3 % ophthalmic solution Place 1 drop into both eyes 4 (four) times daily. 5 mL Mickie Bail, NP      PDMP not reviewed this encounter.   Mickie Bail, NP 01/23/23 0900

## 2023-01-23 NOTE — Discharge Instructions (Addendum)
Use the antibiotic eyedrops as prescribed.    Follow-up with your primary care provider or eye care provider.    Go to the emergency department if you have acute eye pain, changes in your vision, or other concerning symptoms.

## 2023-02-01 ENCOUNTER — Ambulatory Visit
Admission: RE | Admit: 2023-02-01 | Discharge: 2023-02-01 | Disposition: A | Discharge status: Home / Self Care | Payer: PPO | Source: Ambulatory Visit | Attending: Internal Medicine | Admitting: Internal Medicine

## 2023-02-01 DIAGNOSIS — M8580 Other specified disorders of bone density and structure, unspecified site: Secondary | ICD-10-CM

## 2023-02-07 ENCOUNTER — Inpatient Hospital Stay: Payer: PPO | Attending: Hematology and Oncology | Admitting: Adult Health

## 2023-02-07 ENCOUNTER — Encounter: Payer: Self-pay | Admitting: Adult Health

## 2023-02-07 VITALS — BP 135/75 | HR 96 | Temp 97.6°F | Resp 18 | Ht 63.0 in | Wt 150.6 lb

## 2023-02-07 DIAGNOSIS — Z90722 Acquired absence of ovaries, bilateral: Secondary | ICD-10-CM | POA: Diagnosis not present

## 2023-02-07 DIAGNOSIS — Z808 Family history of malignant neoplasm of other organs or systems: Secondary | ICD-10-CM | POA: Insufficient documentation

## 2023-02-07 DIAGNOSIS — Z803 Family history of malignant neoplasm of breast: Secondary | ICD-10-CM | POA: Insufficient documentation

## 2023-02-07 DIAGNOSIS — Z85828 Personal history of other malignant neoplasm of skin: Secondary | ICD-10-CM | POA: Insufficient documentation

## 2023-02-07 DIAGNOSIS — Z90710 Acquired absence of both cervix and uterus: Secondary | ICD-10-CM | POA: Diagnosis not present

## 2023-02-07 DIAGNOSIS — R232 Flushing: Secondary | ICD-10-CM | POA: Insufficient documentation

## 2023-02-07 DIAGNOSIS — D0511 Intraductal carcinoma in situ of right breast: Secondary | ICD-10-CM | POA: Diagnosis present

## 2023-02-07 DIAGNOSIS — Z8042 Family history of malignant neoplasm of prostate: Secondary | ICD-10-CM | POA: Diagnosis not present

## 2023-02-07 DIAGNOSIS — Z79810 Long term (current) use of selective estrogen receptor modulators (SERMs): Secondary | ICD-10-CM | POA: Diagnosis not present

## 2023-02-07 NOTE — Progress Notes (Signed)
SURVIVORSHIP VISIT:  BRIEF ONCOLOGIC HISTORY:  Oncology History  Ductal carcinoma in situ (DCIS) of right breast  09/30/2022 Initial Diagnosis   Screening mammogram detected right breast calcifications UOQ 1.8 cm: Intermediate grade DCIS with necrosis ER 100% PR 95%   10/11/2022 Cancer Staging   Staging form: Breast, AJCC 8th Edition - Clinical: Stage 0 (cTis (DCIS), cN0, cM0, G2, ER+, PR+, HER2: Not Assessed) - Signed by Serena Croissant, MD on 10/11/2022 Stage prefix: Initial diagnosis Histologic grading system: 3 grade system    Genetic Testing   [facility] Custom Panel+RNA was Negative. Of note, a variant of uncertain significance was detected in the BRCA2 gene (c.9159G>T). Report date is 10/19/2022.  The Custom Hereditary Cancers Panel offered by [facility] includes sequencing and/or deletion duplication testing of the following 50 genes: APC, ATM, AXIN2, BAP1, BARD1, BMPR1A, BRCA1, BRCA2, BRIP1, CDH1, CDK4, CDKN2A (p14ARF and p16INK4a only), CHEK2, CTNNA1, DICER1, EPCAM (Deletion/duplication testing only), FH, GREM1 (promoter region duplication testing only), HOXB13, KIT, MBD4, MEN1, MLH1, MSH2, MSH3, MSH6, MUTYH, NF1, NHTL1, PALB2, PDGFRA, PMS2, POLD1, POLE, PRKAR1A, PTEN, RAD51C, RAD51D, RET, SDHA (sequencing analysis only except exon 14), SDHB, SDHC, SDHD, SMAD4, SMARCA4. STK11, TP53, TSC1, TSC2, and VHL.   11/02/2022 Surgery   Right lumpectomy: Grade 2 DCIS with necrosis, solid and cribriform type, 9 mm, margins negative, ER 100%, PR 95%   10/2022 -  Anti-estrogen oral therapy   Tamoxifen     INTERVAL HISTORY:  Ashlee Sanford to review her survivorship care plan detailing her treatment course for breast cancer, as well as monitoring long-term side effects of that treatment, education regarding health maintenance, screening, and overall wellness and health promotion.     Overall, Ashlee Sanford reports feeling quite well.  She is taking tamoxifen at 10 mg daily with good tolerance.  She has  occasional hot flashes but otherwise is doing well.  REVIEW OF SYSTEMS:  Review of Systems  Constitutional:  Negative for appetite change, chills, fatigue, fever and unexpected weight change.  HENT:   Negative for hearing loss, lump/mass and trouble swallowing.   Eyes:  Negative for eye problems and icterus.  Respiratory:  Negative for chest tightness, cough and shortness of breath.   Cardiovascular:  Negative for chest pain, leg swelling and palpitations.  Gastrointestinal:  Negative for abdominal distention, abdominal pain, constipation, diarrhea, nausea and vomiting.  Endocrine: Positive for hot flashes.  Genitourinary:  Negative for difficulty urinating.   Musculoskeletal:  Negative for arthralgias.  Skin:  Negative for itching and rash.  Neurological:  Negative for dizziness, extremity weakness, headaches and numbness.  Hematological:  Negative for adenopathy. Does not bruise/bleed easily.  Psychiatric/Behavioral:  Negative for depression. The patient is not nervous/anxious.    Breast: Denies any new nodularity, masses, tenderness, nipple changes, or nipple discharge.       PAST MEDICAL/SURGICAL HISTORY:  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   PONV (postoperative nausea and vomiting)    Pre-diabetes    Past Surgical History:  Procedure Laterality Date   BREAST BIOPSY Right 09/30/2022   MM RT BREAST BX W LOC DEV 1ST LESION IMAGE BX SPEC STEREO GUIDE 09/30/2022 [facility]   BREAST BIOPSY  10/31/2022   MM RT RADIOACTIVE SEED LOC MAMMO GUIDE 10/31/2022 [facility]   BREAST LUMPECTOMY WITH RADIOACTIVE SEED LOCALIZATION Right 11/02/2022   Procedure: RIGHT BREAST LUMPECTOMY WITH RADIOACTIVE SEED LOCALIZATION;  Surgeon: Harriette Bouillon,  MD;  Location: [facility];  Service: General;  Laterality: Right;   CATARACT EXTRACTION     bilateral   CHOLECYSTECTOMY     CYSTOCELE REPAIR  04/25/2012   Procedure: ANTERIOR REPAIR (CYSTOCELE);  Surgeon: Dorien Chihuahua. Richardson Dopp, MD;  Location: [facility];  Service: Gynecology;  Laterality: N/A;   DILATION AND CURETTAGE OF UTERUS     HERNIA REPAIR     incisional hernia repair   ROBOTIC ASSISTED TOTAL HYSTERECTOMY  04/25/2012   Procedure: ROBOTIC ASSISTED TOTAL HYSTERECTOMY;  Surgeon: Dorien Chihuahua. Richardson Dopp, MD;  Location: [facility];  Service: Gynecology;  Laterality: N/A;   SALPINGOOPHORECTOMY  04/25/2012   Procedure: SALPINGO OOPHORECTOMY;  Surgeon: Dorien Chihuahua. Richardson Dopp, MD;  Location: [facility];  Service: Gynecology;  Laterality: Bilateral;   TONSILLECTOMY     TUBAL LIGATION     WRIST SURGERY  05/22/2006     ALLERGIES:  Allergies  Allergen Reactions   Meprobamate Other (See Comments)    Forgets to breathe   Other Anaphylaxis    Patient is allergic to low molecular weight iron/ dextran   Dilaudid [Hydromorphone Hcl] Nausea And Vomiting   Penicillins Other (See Comments)    Told never to take it based on allergy test as a child   Tiagabine Other (See Comments)    Loss of consciousness     CURRENT MEDICATIONS:  Outpatient Encounter Medications as of 02/07/2023  Medication Sig   CALCIUM-VITAMIN D PO Take 1 drop by mouth 3 (three) times a week.   clonazePAM (KLONOPIN) 0.5 MG tablet Take 0.5 mg by mouth daily as needed for anxiety.   clotrimazole-betamethasone (LOTRISONE) cream Apply 1 Application topically daily as needed (irritation).   Dextran 70-Hypromellose (ARTIFICIAL TEARS PF OP) Place 1 drop into both eyes as needed (dry eyes).   Docusate Sodium (DSS) 100 MG CAPS Take 100 mg by mouth daily.   ibuprofen (ADVIL) 800 MG tablet Take 1 tablet (800 mg total) by mouth every 8 (eight) hours as needed.   MAGNESIUM GLYCINATE PO Take 1 capsule by mouth daily.   tamoxifen (NOLVADEX) 10 MG tablet Take 1 tablet (10 mg total)  by mouth daily.   [DISCONTINUED] oxyCODONE (OXY IR/ROXICODONE) 5 MG immediate release tablet Take 1 tablet (5 mg total) by mouth every 6 (six) hours as needed for severe pain.   ofloxacin (OCUFLOX) 0.3 % ophthalmic solution Place 1 drop into both eyes 4 (four) times daily. (Patient not taking: Reported on 02/07/2023)   No facility-administered encounter medications on file as of 02/07/2023.     ONCOLOGIC FAMILY HISTORY:  Family History  Problem Relation Age of Onset   Breast cancer Mother 78   Prostate cancer Father        metastatic   Prostate cancer Maternal Grandfather 32 - 79       metastatic   Breast cancer Maternal Aunt 50   Prostate cancer Maternal Uncle 1 - 89   Breast cancer Cousin 69 - 39       maternal first cousin   Thyroid cancer Daughter 68 - 1   Thyroid cancer Granddaughter 35     SOCIAL HISTORY:  Social History   Socioeconomic History   Marital status: Single    Spouse name: Not on file   Number of children: Not on  file   Years of education: Not on file   Highest education level: Not on file  Occupational History   Not on file  Tobacco Use   Smoking status: Never   Smokeless tobacco: Not on file  Vaping Use   Vaping status: Never Used  Substance and Sexual Activity   Alcohol use: No   Drug use: No   Sexual activity: Not on file  Other Topics Concern   Not on file  Social History Narrative   Not on file   Social Determinants of Health   Financial Resource Strain: Not on file  Food Insecurity: Not on file  Transportation Needs: Not on file  Physical Activity: Not on file  Stress: Not on file  Social Connections: Not on file  Intimate Partner Violence: Not on file     OBSERVATIONS/OBJECTIVE:  BP 135/75 (BP Location: Right Arm, Patient Position: Sitting)   Pulse 96   Temp 97.6 F (36.4 C) (Tympanic)   Resp 18   Ht 5\' 3"  (1.6 m)   Wt 150 lb 9.6 oz (68.3 kg)   SpO2 100%   BMI 26.68 kg/m  GENERAL: Patient is a well appearing female in  no acute distress HEENT:  Sclerae anicteric.  Oropharynx clear and moist. No ulcerations or evidence of oropharyngeal candidiasis. Neck is supple.  NODES:  No cervical, supraclavicular, or axillary lymphadenopathy palpated.  BREAST EXAM:  Right breast s/p lumpectomy, no sign of local recurrence.  Left breast is benign. LUNGS:  Clear to auscultation bilaterally.  No wheezes or rhonchi. HEART:  Regular rate and rhythm. No murmur appreciated. ABDOMEN:  Soft, nontender.  Positive, normoactive bowel sounds. No organomegaly palpated. MSK:  No focal spinal tenderness to palpation. Full range of motion bilaterally in the upper extremities. EXTREMITIES:  No peripheral edema.   SKIN:  Clear with no obvious rashes or skin changes. No nail dyscrasia. NEURO:  Nonfocal. Well oriented.  Appropriate affect.   LABORATORY DATA:  None for this visit.  DIAGNOSTIC IMAGING:  None for this visit.      ASSESSMENT AND PLAN:  Ms.. Sanford is a pleasant 77 y.o. female with Stage 0 right breast DCIS, ER+/PR+, diagnosed in 09/2022, treated with lumpectomy and anti-estrogen therapy with Tamoxifen beginning in 10/2022.  She presents to the Survivorship Clinic for our initial meeting and routine follow-up post-completion of treatment for breast cancer.    1. Stage 0 right breast cancer:  Ashlee Sanford is continuing to recover from definitive treatment for breast cancer. She will follow-up with her medical oncologist, Dr.  Pamelia Hoit in 6 months with history and physical exam per surveillance protocol.  She will continue her anti-estrogen therapy with Tamoxifen. Thus far, she is tolerating the Tamoxifen well, with minimal side effects. Her mammogram is due 08/2023; orders placed today.   Today, a comprehensive survivorship care plan and treatment summary was reviewed with the patient today detailing her breast cancer diagnosis, treatment course, potential late/long-term effects of treatment, appropriate follow-up care with  recommendations for the future, and patient education resources.  A copy of this summary, along with a letter will be sent to the patient's primary care provider via mail/fax/In Basket message after today's visit.    2. Bone health:  She was given education on specific activities to promote bone health.  3. Cancer screening:  Due to Ashlee Sanford's history and her age, she should receive screening for skin cancers, colon cancer, and gynecologic cancers.  The information and recommendations are listed on the patient's  comprehensive care plan/treatment summary and were reviewed in detail with the patient.    4. Health maintenance and wellness promotion: Ashlee Sanford was encouraged to consume 5-7 servings of fruits and vegetables per day. We reviewed the "Nutrition Rainbow" handout.  She was also encouraged to engage in moderate to vigorous exercise for 30 minutes per day most days of the week.  She was instructed to limit her alcohol consumption and continue to abstain from tobacco use.     5. Support services/counseling: It is not uncommon for this period of the patient's cancer care trajectory to be one of many emotions and stressors.   She was given information regarding our available services and encouraged to contact me with any questions or for help enrolling in any of our support group/programs.    Follow up instructions:    -Return to cancer center in 6 months for f/u with Dr. Pamelia Hoit  -Mammogram due in 08/2023 -She is welcome to return back to the Survivorship Clinic at any time; no additional follow-up needed at this time.  -Consider referral back to survivorship as a long-term survivor for continued surveillance  The patient was provided an opportunity to ask questions and all were answered. The patient agreed with the plan and demonstrated an understanding of the instructions.   Total encounter time:45 minutes*in face-to-face visit time, chart review, lab review, care coordination, order  entry, and documentation of the encounter time.    Lillard Anes, NP 02/07/23 11:06 AM Medical Oncology and Hematology [facility] [address] [place], [place] [address] Tel. [phone]    Fax. [phone]  *Total Encounter Time as defined by the Centers for Medicare and Medicaid Services includes, in addition to the face-to-face time of a patient visit (documented in the note above) non-face-to-face time: obtaining and reviewing outside history, ordering and reviewing medications, tests or procedures, care coordination (communications with other health care professionals or caregivers) and documentation in the medical record.

## 2023-02-22 ENCOUNTER — Encounter: Payer: Self-pay | Admitting: Adult Health

## 2023-07-03 DIAGNOSIS — L570 Actinic keratosis: Secondary | ICD-10-CM | POA: Diagnosis not present

## 2023-07-03 DIAGNOSIS — Z85828 Personal history of other malignant neoplasm of skin: Secondary | ICD-10-CM | POA: Diagnosis not present

## 2023-07-03 DIAGNOSIS — D2262 Melanocytic nevi of left upper limb, including shoulder: Secondary | ICD-10-CM | POA: Diagnosis not present

## 2023-07-03 DIAGNOSIS — D225 Melanocytic nevi of trunk: Secondary | ICD-10-CM | POA: Diagnosis not present

## 2023-07-03 DIAGNOSIS — D2261 Melanocytic nevi of right upper limb, including shoulder: Secondary | ICD-10-CM | POA: Diagnosis not present

## 2023-08-07 ENCOUNTER — Inpatient Hospital Stay: Payer: PPO | Attending: Hematology and Oncology | Admitting: Hematology and Oncology

## 2023-08-07 VITALS — BP 146/82 | HR 95 | Temp 97.5°F | Resp 18 | Ht 63.0 in | Wt 147.8 lb

## 2023-08-07 DIAGNOSIS — M8580 Other specified disorders of bone density and structure, unspecified site: Secondary | ICD-10-CM | POA: Insufficient documentation

## 2023-08-07 DIAGNOSIS — Z170 Estrogen receptor positive status [ER+]: Secondary | ICD-10-CM | POA: Insufficient documentation

## 2023-08-07 DIAGNOSIS — D0511 Intraductal carcinoma in situ of right breast: Secondary | ICD-10-CM | POA: Diagnosis not present

## 2023-08-07 DIAGNOSIS — Z79810 Long term (current) use of selective estrogen receptor modulators (SERMs): Secondary | ICD-10-CM | POA: Insufficient documentation

## 2023-08-07 DIAGNOSIS — R5383 Other fatigue: Secondary | ICD-10-CM | POA: Diagnosis not present

## 2023-08-07 DIAGNOSIS — Z1721 Progesterone receptor positive status: Secondary | ICD-10-CM | POA: Diagnosis not present

## 2023-08-07 NOTE — Progress Notes (Signed)
 Patient Care Team: Ollen Bowl, MD as PCP - General (Internal Medicine) Serena Croissant, MD as Consulting Physician (Hematology and Oncology) Harriette Bouillon, MD as Consulting Physician (General Surgery) Antony Blackbird, MD as Consulting Physician (Radiation Oncology)  DIAGNOSIS:  Encounter Diagnosis  Name Primary?   Ductal carcinoma in situ (DCIS) of right breast Yes    SUMMARY OF ONCOLOGIC HISTORY: Oncology History  Ductal carcinoma in situ (DCIS) of right breast  09/30/2022 Initial Diagnosis   Screening mammogram detected right breast calcifications UOQ 1.8 cm: Intermediate grade DCIS with necrosis ER 100% PR 95%   10/11/2022 Cancer Staging   Staging form: Breast, AJCC 8th Edition - Clinical: Stage 0 (cTis (DCIS), cN0, cM0, G2, ER+, PR+, HER2: Not Assessed) - Signed by Serena Croissant, MD on 10/11/2022 Stage prefix: Initial diagnosis Histologic grading system: 3 grade system    Genetic Testing   [employer] Custom Panel+RNA was Negative. Of note, a variant of uncertain significance was detected in the BRCA2 gene (c.9159G>T). Report date is 10/19/2022.  The Custom Hereditary Cancers Panel offered by [facility] includes sequencing and/or deletion duplication testing of the following 50 genes: APC, ATM, AXIN2, BAP1, BARD1, BMPR1A, BRCA1, BRCA2, BRIP1, CDH1, CDK4, CDKN2A (p14ARF and p16INK4a only), CHEK2, CTNNA1, DICER1, EPCAM (Deletion/duplication testing only), FH, GREM1 (promoter region duplication testing only), HOXB13, KIT, MBD4, MEN1, MLH1, MSH2, MSH3, MSH6, MUTYH, NF1, NHTL1, PALB2, PDGFRA, PMS2, POLD1, POLE, PRKAR1A, PTEN, RAD51C, RAD51D, RET, SDHA (sequencing analysis only except exon 14), SDHB, SDHC, SDHD, SMAD4, SMARCA4. STK11, TP53, TSC1, TSC2, and VHL.   11/02/2022 Surgery   Right lumpectomy: Grade 2 DCIS with necrosis, solid and cribriform type, 9 mm, margins negative, ER 100%, PR 95%   10/2022 -  Anti-estrogen oral therapy   Tamoxifen     CHIEF COMPLIANT: Follow-up on  tamoxifen therapy  HISTORY OF PRESENT ILLNESS:  History of Present Illness Ashlee Sanford, a patient with a history of breast cancer, presents for a follow-up visit after starting tamoxifen treatment. She reports initial hot flashes, which have since resolved, and occasional muscle cramps. She also notes a decrease in energy and motivation, which she attributes to either the medication or normal aging. She has not experienced any significant mood swings or irritability. She also mentions a recent bout of what she believes was a mild flu, which lasted a month and resulted in some weight loss. She is scheduled for a mammogram at the end of April and has been managing to get her tamoxifen without any issues.     ALLERGIES:  is allergic to meprobamate, other, dilaudid [hydromorphone hcl], penicillins, and tiagabine.  MEDICATIONS:  Current Outpatient Medications  Medication Sig Dispense Refill   CALCIUM-VITAMIN D PO Take 600 mg by mouth 2 (two) times daily.     clonazePAM (KLONOPIN) 0.5 MG tablet Take 0.5 mg by mouth daily as needed for anxiety.     clotrimazole-betamethasone (LOTRISONE) cream Apply 1 Application topically daily as needed (irritation).     Dextran 70-Hypromellose (ARTIFICIAL TEARS PF OP) Place 1 drop into both eyes as needed (dry eyes).     Docusate Sodium (DSS) 100 MG CAPS Take 100 mg by mouth daily.     MAGNESIUM GLYCINATE PO Take 1 capsule by mouth daily.     tamoxifen (NOLVADEX) 10 MG tablet Take 1 tablet (10 mg total) by mouth daily. 90 tablet 3   No current facility-administered medications for this visit.    PHYSICAL EXAMINATION: ECOG PERFORMANCE STATUS: 1 - Symptomatic but completely ambulatory  Vitals:  08/07/23 1001  BP: (!) 146/82  Pulse: 95  Resp: 18  Temp: (!) 97.5 F (36.4 C)  SpO2: 100%   Filed Weights   08/07/23 1001  Weight: 147 lb 12.8 oz (67 kg)    LABORATORY DATA:  I have reviewed the data as listed    Latest Ref Rng & Units 10/11/2022   12:17 PM  04/22/2012    4:00 PM 10/21/2008    8:53 AM  CMP  Glucose 70 - 99 mg/dL 086  86  578   BUN 8 - 23 mg/dL 10  10  10    Creatinine 0.44 - 1.00 mg/dL 4.69  6.29  0.7   Sodium 135 - 145 mmol/L 136  137  144   Potassium 3.5 - 5.1 mmol/L 3.9  3.9  4.3   Chloride 98 - 111 mmol/L 103  99  111   CO2 22 - 32 mmol/L 28  28  30    Calcium 8.9 - 10.3 mg/dL 9.5  9.7  9.3   Total Protein 6.5 - 8.1 g/dL 7.4  7.7  7.6   Total Bilirubin 0.3 - 1.2 mg/dL 0.5  0.3  0.9   Alkaline Phos 38 - 126 U/L 85  95  94   AST 15 - 41 U/L 15  21  23    ALT 0 - 44 U/L 11  15  24      Lab Results  Component Value Date   WBC 7.7 10/11/2022   HGB 13.7 10/11/2022   HCT 39.2 10/11/2022   MCV 86.3 10/11/2022   PLT 231 10/11/2022   NEUTROABS 4.5 10/11/2022    ASSESSMENT & PLAN:  Ductal carcinoma in situ (DCIS) of right breast 11/02/2022: Right lumpectomy: Grade 2 DCIS with necrosis, solid and cribriform type, 9 mm, margins negative, ER 100%, PR 95%   Treatment plan: Adjuvant radiation therapy (patient thought about all her options and made a decision that she does not want to go through radiation) antiestrogen therapy with tamoxifen 5 years started June 2024 (10 mg dose) ------------------------------------------------------------------------------------------------------------------------------------------------------- Tamoxifen toxicities: Initially she had mild hot flashes which have resolved.  Occasional leg cramps. Slight fatigue  Breast cancer surveillance: Breast exam 08/07/2023: Benign Mammogram scheduled for 09/19/2023  Return to clinic in October for follow-up and after that we can see her once a year. ------------------------------------- Assessment and Plan Assessment & Plan Ductal carcinoma in situ (DCIS) of right breast Currently on tamoxifen 10 mg daily, well-tolerated with minor side effects. Fatigue possibly related to tamoxifen or aging. Surgery completed last June. Mammogram scheduled for April.  Tamoxifen may aid osteopenia. - Continue tamoxifen 10 mg daily. - Ensure mammogram appointment at the end of April. - Schedule follow-up in October for breast exam and check-up.  Osteopenia Tamoxifen expected to improve bone density with noted improvement. - Monitor bone density in future assessments.  Fatigue Fatigue possibly due to tamoxifen or aging, common with tamoxifen use. - Monitor energy levels and assess if symptoms persist or worsen.      No orders of the defined types were placed in this encounter.  The patient has a good understanding of the overall plan. she agrees with it. she will call with any problems that may develop before the next visit here. Total time spent: 30 mins including face to face time and time spent for planning, charting and co-ordination of care   Tamsen Meek, MD 08/07/23

## 2023-08-07 NOTE — Assessment & Plan Note (Signed)
 11/02/2022: Right lumpectomy: Grade 2 DCIS with necrosis, solid and cribriform type, 9 mm, margins negative, ER 100%, PR 95%   Treatment plan: Adjuvant radiation therapy (patient thought about all her options and made a decision that she does not want to go through radiation) antiestrogen therapy with tamoxifen 5 years started June 2024 (10 mg dose) ------------------------------------------------------------------------------------------------------------------------------------------------------- Tamoxifen toxicities:  Breast cancer surveillance: Breast exam 08/07/2023: Benign Mammogram scheduled for 09/19/2023  Return to clinic in 1 year for follow-up

## 2023-08-20 DIAGNOSIS — C4490 Unspecified malignant neoplasm of skin, unspecified: Secondary | ICD-10-CM | POA: Diagnosis not present

## 2023-08-20 DIAGNOSIS — Z0000 Encounter for general adult medical examination without abnormal findings: Secondary | ICD-10-CM | POA: Diagnosis not present

## 2023-08-20 DIAGNOSIS — J45909 Unspecified asthma, uncomplicated: Secondary | ICD-10-CM | POA: Diagnosis not present

## 2023-08-20 DIAGNOSIS — H9190 Unspecified hearing loss, unspecified ear: Secondary | ICD-10-CM | POA: Diagnosis not present

## 2023-08-20 DIAGNOSIS — E7800 Pure hypercholesterolemia, unspecified: Secondary | ICD-10-CM | POA: Diagnosis not present

## 2023-08-20 DIAGNOSIS — R7303 Prediabetes: Secondary | ICD-10-CM | POA: Diagnosis not present

## 2023-09-19 ENCOUNTER — Ambulatory Visit
Admission: RE | Admit: 2023-09-19 | Discharge: 2023-09-19 | Disposition: A | Discharge status: Home / Self Care | Payer: PPO | Source: Ambulatory Visit | Attending: Adult Health | Admitting: Adult Health

## 2023-09-19 DIAGNOSIS — D0511 Intraductal carcinoma in situ of right breast: Secondary | ICD-10-CM

## 2023-09-19 DIAGNOSIS — Z08 Encounter for follow-up examination after completed treatment for malignant neoplasm: Secondary | ICD-10-CM | POA: Diagnosis not present

## 2023-09-19 DIAGNOSIS — Z853 Personal history of malignant neoplasm of breast: Secondary | ICD-10-CM | POA: Diagnosis not present

## 2023-10-22 ENCOUNTER — Encounter: Payer: Self-pay | Admitting: Hematology and Oncology

## 2023-10-22 ENCOUNTER — Other Ambulatory Visit: Payer: Self-pay

## 2023-10-22 MED ORDER — TAMOXIFEN CITRATE 10 MG PO TABS: 10.0000 mg | ORAL_TABLET | Freq: Every day | ORAL | 3 refills | Status: AC

## 2023-10-29 DIAGNOSIS — R300 Dysuria: Secondary | ICD-10-CM | POA: Diagnosis not present

## 2023-10-29 DIAGNOSIS — R195 Other fecal abnormalities: Secondary | ICD-10-CM | POA: Diagnosis not present

## 2023-12-07 DIAGNOSIS — D0511 Intraductal carcinoma in situ of right breast: Secondary | ICD-10-CM | POA: Diagnosis not present

## 2024-01-25 DIAGNOSIS — H9190 Unspecified hearing loss, unspecified ear: Secondary | ICD-10-CM | POA: Diagnosis not present

## 2024-02-26 ENCOUNTER — Inpatient Hospital Stay: Attending: Hematology and Oncology | Admitting: Hematology and Oncology

## 2024-02-26 VITALS — BP 125/62 | HR 92 | Temp 98.1°F | Resp 16 | Ht 63.0 in | Wt 151.1 lb

## 2024-02-26 DIAGNOSIS — Z170 Estrogen receptor positive status [ER+]: Secondary | ICD-10-CM | POA: Diagnosis not present

## 2024-02-26 DIAGNOSIS — Z1721 Progesterone receptor positive status: Secondary | ICD-10-CM | POA: Diagnosis not present

## 2024-02-26 DIAGNOSIS — Z79810 Long term (current) use of selective estrogen receptor modulators (SERMs): Secondary | ICD-10-CM | POA: Insufficient documentation

## 2024-02-26 DIAGNOSIS — D0511 Intraductal carcinoma in situ of right breast: Secondary | ICD-10-CM | POA: Diagnosis not present

## 2024-02-26 DIAGNOSIS — R5383 Other fatigue: Secondary | ICD-10-CM | POA: Insufficient documentation

## 2024-02-26 NOTE — Progress Notes (Signed)
 Patient Care Team: Vernon Velna SAUNDERS, MD as PCP - General (Internal Medicine) Odean Potts, MD as Consulting Physician (Hematology and Oncology) Vanderbilt Ned, MD as Consulting Physician (General Surgery) Shannon Agent, MD as Consulting Physician (Radiation Oncology)  DIAGNOSIS:  Encounter Diagnosis  Name Primary?   Ductal carcinoma in situ (DCIS) of right breast Yes    SUMMARY OF ONCOLOGIC HISTORY: Oncology History  Ductal carcinoma in situ (DCIS) of right breast  09/30/2022 Initial Diagnosis   Screening mammogram detected right breast calcifications UOQ 1.8 cm: Intermediate grade DCIS with necrosis ER 100% PR 95%   10/11/2022 Cancer Staging   Staging form: Breast, AJCC 8th Edition - Clinical: Stage 0 (cTis (DCIS), cN0, cM0, G2, ER+, PR+, HER2: Not Assessed) - Signed by Odean Potts, MD on 10/11/2022 Stage prefix: Initial diagnosis Histologic grading system: 3 grade system    Genetic Testing   [facility] Custom Panel+RNA was Negative. Of note, a variant of uncertain significance was detected in the BRCA2 gene ([id number].[id number]>T). Report date is 10/19/2022.  The Custom Hereditary Cancers Panel offered by [facility] includes sequencing and/or deletion duplication testing of the following 50 genes: APC, ATM, AXIN2, BAP1, BARD1, BMPR1A, BRCA1, BRCA2, BRIP1, CDH1, CDK4, CDKN2A (p14ARF and p16INK4a only), CHEK2, CTNNA1, DICER1, EPCAM (Deletion/duplication testing only), FH, GREM1 (promoter region duplication testing only), HOXB13, KIT, MBD4, MEN1, MLH1, MSH2, MSH3, MSH6, MUTYH, NF1, NHTL1, PALB2, PDGFRA, PMS2, POLD1, POLE, PRKAR1A, PTEN, RAD51C, RAD51D, RET, SDHA (sequencing analysis only except exon 14), SDHB, SDHC, SDHD, SMAD4, SMARCA4. STK11, TP53, TSC1, TSC2, and VHL.   11/02/2022 Surgery   Right lumpectomy: Grade 2 DCIS with necrosis, solid and cribriform type, 9 mm, margins negative, ER 100%, PR 95%   10/2022 -  Anti-estrogen oral therapy   Tamoxifen      CHIEF COMPLIANT: Follow-up on  tamoxifen  therapy  HISTORY OF PRESENT ILLNESS:  History of Present Illness Ashlee Sanford is a 78 year old female on tamoxifen  who presents for a follow-up visit.  She experiences minimal side effects from tamoxifen , with hot flashes now nonexistent and a slight vaginal discharge that is not concerning. She has noticed decreased energy levels, which she attributes to aging or possibly tamoxifen . Previously active in yoga and circuit classes, she now lacks the energy for circuit classes but continues to exercise at home, adjusting the duration to match her energy levels. She regularly walks for 20 to 30 minutes. No breast pain or discomfort is present. Her last mammogram in April showed a category B breast density.     ALLERGIES:  is allergic to meprobamate, other, dilaudid  [hydromorphone  hcl], penicillins, and tiagabine.  MEDICATIONS:  Current Outpatient Medications  Medication Sig Dispense Refill   CALCIUM-VITAMIN D PO Take 600 mg by mouth 2 (two) times daily.     clonazePAM (KLONOPIN) 0.5 MG tablet Take 0.5 mg by mouth daily as needed for anxiety.     clotrimazole-betamethasone (LOTRISONE) cream Apply 1 Application topically daily as needed (irritation).     Dextran 70-Hypromellose (ARTIFICIAL TEARS PF OP) Place 1 drop into both eyes as needed (dry eyes).     Docusate Sodium (DSS) 100 MG CAPS Take 100 mg by mouth daily.     MAGNESIUM GLYCINATE PO Take 1 capsule by mouth daily.     tamoxifen  (NOLVADEX ) 10 MG tablet Take 1 tablet (10 mg total) by mouth daily. 90 tablet 3   No current facility-administered medications for this visit.    PHYSICAL EXAMINATION: ECOG PERFORMANCE STATUS: 1 - Symptomatic but completely ambulatory  Vitals:  02/26/24 1110  BP: 125/62  Pulse: 92  Resp: 16  Temp: 98.1 F (36.7 C)  SpO2: 100%   Filed Weights   02/26/24 1110  Weight: 151 lb 1.6 oz (68.5 kg)    Physical Exam Breast exam: No palpable lumps nodules bilateral breasts or axilla  (exam  performed in the presence of a chaperone)  LABORATORY DATA:  I have reviewed the data as listed    Latest Ref Rng & Units 10/11/2022   12:17 PM 04/22/2012    4:00 PM 10/21/2008    8:53 AM  CMP  Glucose 70 - 99 mg/dL 868  86  887   BUN 8 - 23 mg/dL 10  10  10    Creatinine 0.44 - 1.00 mg/dL 9.30  9.35  0.7   Sodium 135 - 145 mmol/L 136  137  144   Potassium 3.5 - 5.1 mmol/L 3.9  3.9  4.3   Chloride 98 - 111 mmol/L 103  99  111   CO2 22 - 32 mmol/L 28  28  30    Calcium 8.9 - 10.3 mg/dL 9.5  9.7  9.3   Total Protein 6.5 - 8.1 g/dL 7.4  7.7  7.6   Total Bilirubin 0.3 - 1.2 mg/dL 0.5  0.3  0.9   Alkaline Phos 38 - 126 U/L 85  95  94   AST 15 - 41 U/L 15  21  23    ALT 0 - 44 U/L 11  15  24      Lab Results  Component Value Date   WBC 7.7 10/11/2022   HGB 13.7 10/11/2022   HCT 39.2 10/11/2022   MCV 86.3 10/11/2022   PLT 231 10/11/2022   NEUTROABS 4.5 10/11/2022    ASSESSMENT & PLAN:  Ductal carcinoma in situ (DCIS) of right breast 11/02/2022: Right lumpectomy: Grade 2 DCIS with necrosis, solid and cribriform type, 9 mm, margins negative, ER 100%, PR 95%   Treatment plan: Adjuvant radiation therapy (patient thought about all her options and made a decision that she does not want to go through radiation) antiestrogen therapy with tamoxifen  5 years started June 2024 (10 mg dose) ------------------------------------------------------------------------------------------------------------------------------------------------------- Tamoxifen  toxicities: Initially she had mild hot flashes which have resolved.  Occasional leg cramps. Slight fatigue   Breast cancer surveillance: Breast exam 08/07/2023: Benign Mammogram 09/19/2023: Benign breast density category B   Return to clinic in 1 year for follow-up ------------------------------------- Assessment and Plan Assessment & Plan Ductal carcinoma in situ of right breast On tamoxifen  10 mg daily, well-tolerated. Last mammogram normal,  breast density category B. - Continue tamoxifen  10 mg oral daily. - Schedule annual mammogram in April. - Follow up in one year.  Fatigue Fatigue possibly related to tamoxifen . Regular exercise maintained, decreased energy for intense activities. - Encourage continuation of regular exercise routine. - Monitor fatigue symptoms and adjust activities as needed.      No orders of the defined types were placed in this encounter.  The patient has a good understanding of the overall plan. she agrees with it. she will call with any problems that may develop before the next visit here.  I personally spent a total of 30 minutes in the care of the patient today including preparing to see the patient, getting/reviewing separately obtained history, performing a medically appropriate exam/evaluation, counseling and educating, placing orders, referring and communicating with other health care professionals, documenting clinical information in the EHR, independently interpreting results, communicating results, and coordinating care.   Viinay K Drayce Tawil,  MD 02/26/24

## 2024-02-26 NOTE — Assessment & Plan Note (Signed)
 11/02/2022: Right lumpectomy: Grade 2 DCIS with necrosis, solid and cribriform type, 9 mm, margins negative, ER 100%, PR 95%   Treatment plan: Adjuvant radiation therapy (patient thought about all her options and made a decision that she does not want to go through radiation) antiestrogen therapy with tamoxifen  5 years started June 2024 (10 mg dose) ------------------------------------------------------------------------------------------------------------------------------------------------------- Tamoxifen  toxicities: Initially she had mild hot flashes which have resolved.  Occasional leg cramps. Slight fatigue   Breast cancer surveillance: Breast exam 08/07/2023: Benign Mammogram 09/19/2023: Benign breast density category B   Return to clinic in 1 year for follow-up

## 2024-03-03 DIAGNOSIS — M3501 Sicca syndrome with keratoconjunctivitis: Secondary | ICD-10-CM | POA: Diagnosis not present

## 2024-03-03 DIAGNOSIS — H35361 Drusen (degenerative) of macula, right eye: Secondary | ICD-10-CM | POA: Diagnosis not present

## 2024-03-03 DIAGNOSIS — Z961 Presence of intraocular lens: Secondary | ICD-10-CM | POA: Diagnosis not present

## 2025-03-02 ENCOUNTER — Ambulatory Visit: Admitting: Hematology and Oncology
